# Patient Record
Sex: Female | Born: 1950 | Race: Black or African American | Hispanic: No | Marital: Married | Smoking: Never smoker
Health system: Southern US, Community
[De-identification: ages and names within clinical notes are randomized; demographics above are authoritative.]

## PROBLEM LIST (undated history)

## (undated) DIAGNOSIS — R05 Cough: Secondary | ICD-10-CM

## (undated) DIAGNOSIS — C801 Malignant (primary) neoplasm, unspecified: Secondary | ICD-10-CM

## (undated) DIAGNOSIS — K219 Gastro-esophageal reflux disease without esophagitis: Secondary | ICD-10-CM

## (undated) DIAGNOSIS — J42 Unspecified chronic bronchitis: Secondary | ICD-10-CM

## (undated) DIAGNOSIS — A630 Anogenital (venereal) warts: Secondary | ICD-10-CM

## (undated) DIAGNOSIS — D693 Immune thrombocytopenic purpura: Secondary | ICD-10-CM

## (undated) DIAGNOSIS — D759 Disease of blood and blood-forming organs, unspecified: Secondary | ICD-10-CM

## (undated) DIAGNOSIS — K6282 Dysplasia of anus: Secondary | ICD-10-CM

## (undated) DIAGNOSIS — L729 Follicular cyst of the skin and subcutaneous tissue, unspecified: Secondary | ICD-10-CM

## (undated) DIAGNOSIS — IMO0002 Reserved for concepts with insufficient information to code with codable children: Secondary | ICD-10-CM

## (undated) HISTORY — PX: BREAST SURGERY: SHX581

## (undated) HISTORY — PX: OOPHORECTOMY: SHX86

## (undated) HISTORY — DX: Dysplasia of anus: K62.82

## (undated) HISTORY — DX: Malignant (primary) neoplasm, unspecified: C80.1

## (undated) HISTORY — PX: OTHER SURGICAL HISTORY: SHX169

## (undated) HISTORY — DX: Anogenital (venereal) warts: A63.0

## (undated) HISTORY — DX: Immune thrombocytopenic purpura: D69.3

## (undated) HISTORY — PX: HAND SURGERY: SHX662

## (undated) HISTORY — DX: Cough: R05

---

## 1997-10-17 ENCOUNTER — Other Ambulatory Visit: Admission: RE | Admit: 1997-10-17 | Discharge: 1997-10-17 | Payer: Self-pay | Admitting: Radiology

## 1997-11-06 ENCOUNTER — Ambulatory Visit (HOSPITAL_BASED_OUTPATIENT_CLINIC_OR_DEPARTMENT_OTHER): Admission: RE | Admit: 1997-11-06 | Discharge: 1997-11-06 | Payer: Self-pay | Admitting: Surgery

## 1999-11-02 ENCOUNTER — Other Ambulatory Visit: Admission: RE | Admit: 1999-11-02 | Discharge: 1999-11-02 | Payer: Self-pay | Admitting: Gynecology

## 2002-02-05 ENCOUNTER — Other Ambulatory Visit: Admission: RE | Admit: 2002-02-05 | Discharge: 2002-02-05 | Payer: Self-pay | Admitting: Gynecology

## 2003-04-23 ENCOUNTER — Other Ambulatory Visit: Admission: RE | Admit: 2003-04-23 | Discharge: 2003-04-23 | Payer: Self-pay | Admitting: Gynecology

## 2004-04-29 ENCOUNTER — Other Ambulatory Visit: Admission: RE | Admit: 2004-04-29 | Discharge: 2004-04-29 | Payer: Self-pay | Admitting: Gynecology

## 2004-09-04 ENCOUNTER — Ambulatory Visit (HOSPITAL_COMMUNITY): Admission: RE | Admit: 2004-09-04 | Discharge: 2004-09-04 | Payer: Self-pay | Admitting: Gastroenterology

## 2004-11-05 ENCOUNTER — Encounter: Admission: RE | Admit: 2004-11-05 | Discharge: 2004-11-05 | Payer: Self-pay | Admitting: Gastroenterology

## 2005-02-02 ENCOUNTER — Emergency Department (HOSPITAL_COMMUNITY): Admission: EM | Admit: 2005-02-02 | Discharge: 2005-02-03 | Payer: Self-pay | Admitting: Emergency Medicine

## 2005-05-10 HISTORY — PX: OTHER SURGICAL HISTORY: SHX169

## 2005-05-11 ENCOUNTER — Other Ambulatory Visit: Admission: RE | Admit: 2005-05-11 | Discharge: 2005-05-11 | Payer: Self-pay | Admitting: Gynecology

## 2006-01-01 ENCOUNTER — Encounter: Admission: RE | Admit: 2006-01-01 | Discharge: 2006-01-01 | Payer: Self-pay | Admitting: Gastroenterology

## 2006-03-17 ENCOUNTER — Ambulatory Visit (HOSPITAL_COMMUNITY): Admission: EM | Admit: 2006-03-17 | Discharge: 2006-03-17 | Payer: Self-pay | Admitting: Gastroenterology

## 2006-03-17 ENCOUNTER — Encounter (INDEPENDENT_AMBULATORY_CARE_PROVIDER_SITE_OTHER): Payer: Self-pay | Admitting: Specialist

## 2006-05-23 ENCOUNTER — Other Ambulatory Visit: Admission: RE | Admit: 2006-05-23 | Discharge: 2006-05-23 | Payer: Self-pay | Admitting: Gynecology

## 2007-07-14 ENCOUNTER — Other Ambulatory Visit: Admission: RE | Admit: 2007-07-14 | Discharge: 2007-07-14 | Payer: Self-pay | Admitting: Gynecology

## 2007-08-21 IMAGING — CT CT HEAD W/O CM
1 series · 16 of 30 positions shown, 20 images · IV contrast (agent unspecified)
Comparison: None

CLINICAL DATA: Syncope

HEAD CT WITHOUT CONTRAST:
TECHNIQUE: 5mm collimated images were obtained from the base of the skull
through the vertex according to standard protocol without contrast.

[Series 2: head_seq 4.5 h42s st · axial · 0.45mm/px · z∈[+1057,+1183]mm · 16 of 32 slices shown, 20 images]
[im 2/32  brain]
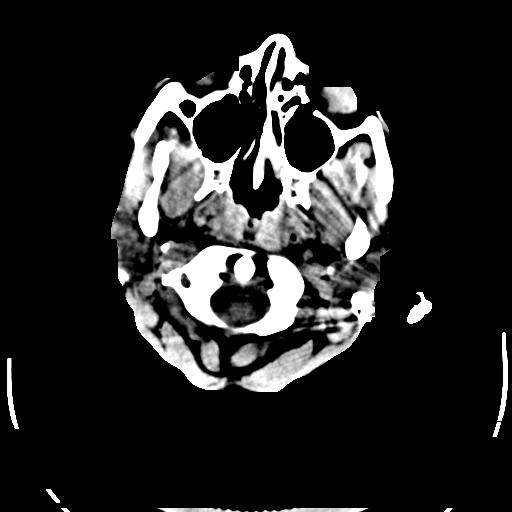
[im 2/32  bone]
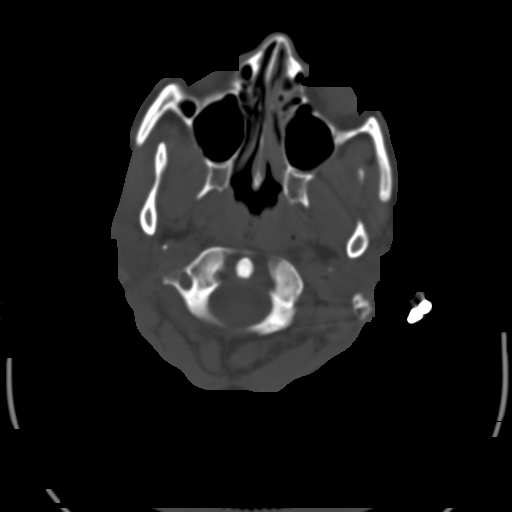
[im 4/32  brain]
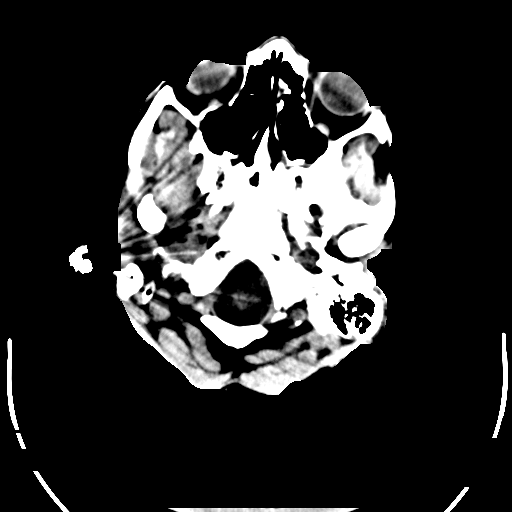
[im 6/32  brain]
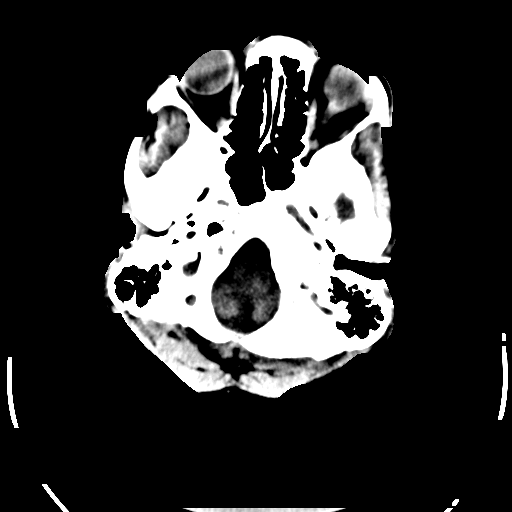
[im 8/32  brain]
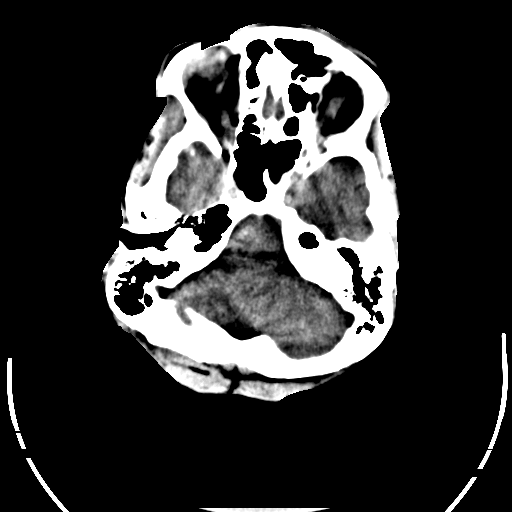
[im 9/32  brain]
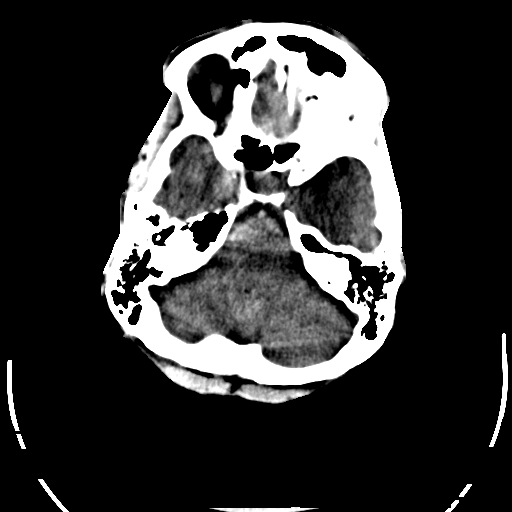
[im 9/32  bone]
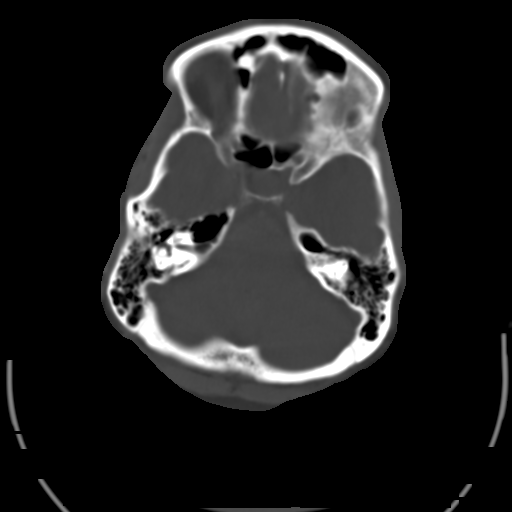
[im 11/32  brain]
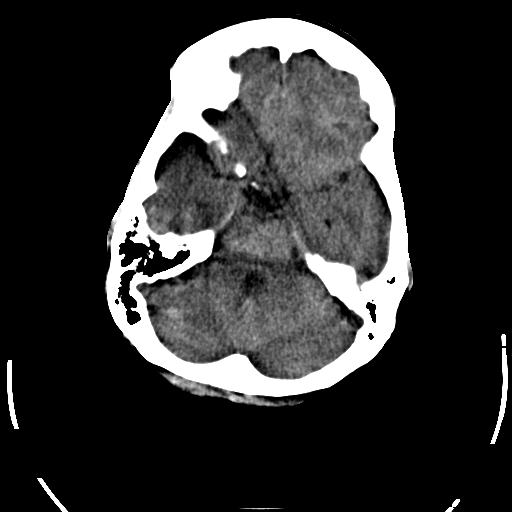
[im 13/32  brain]
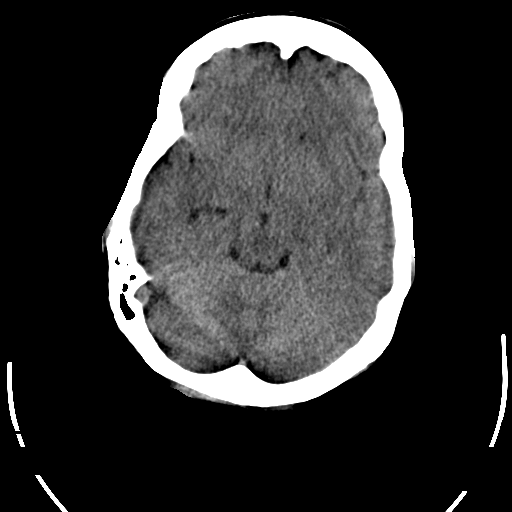
[im 15/32  brain]
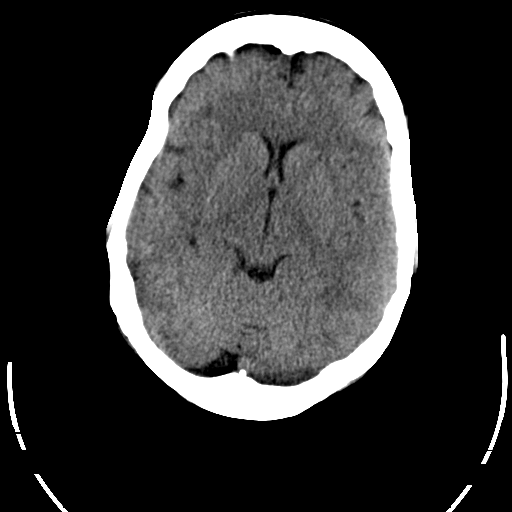
[im 17/32  brain]
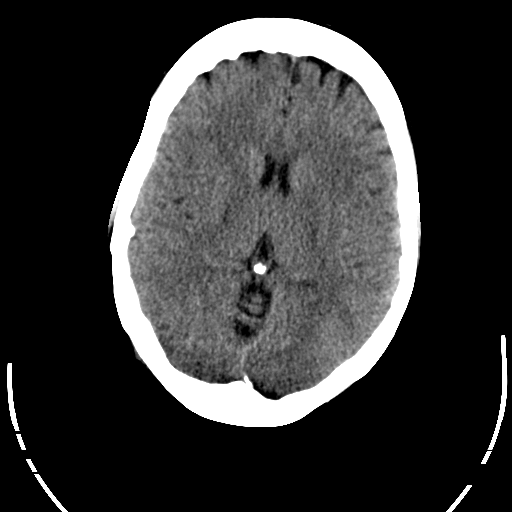
[im 17/32  bone]
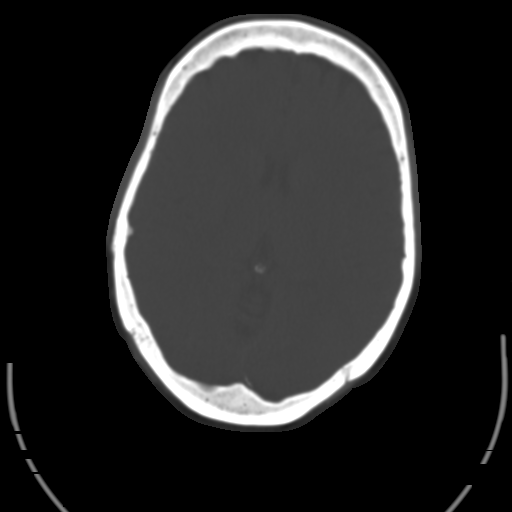
[im 19/32  brain]
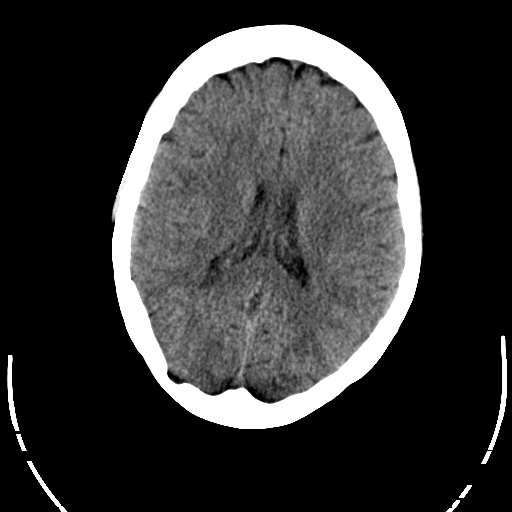
[im 21/32  brain]
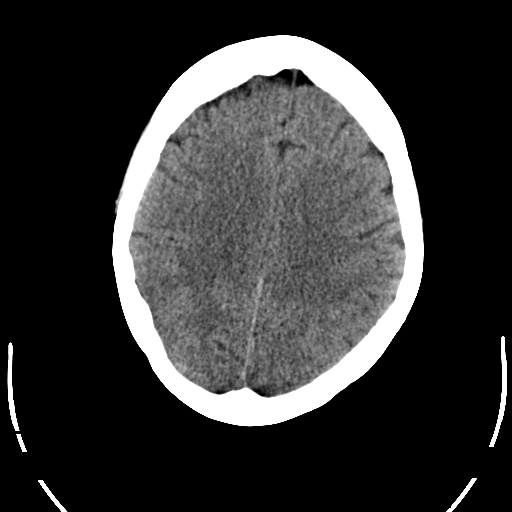
[im 23/32  brain]
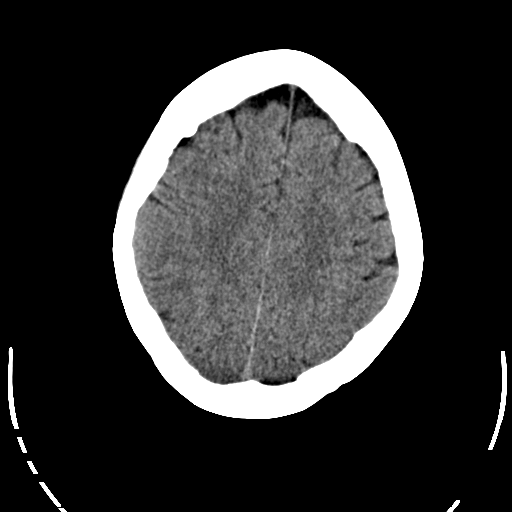
[im 24/32  brain]
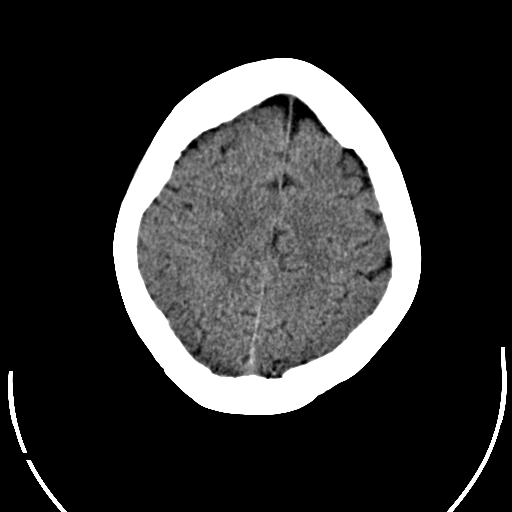
[im 24/32  bone]
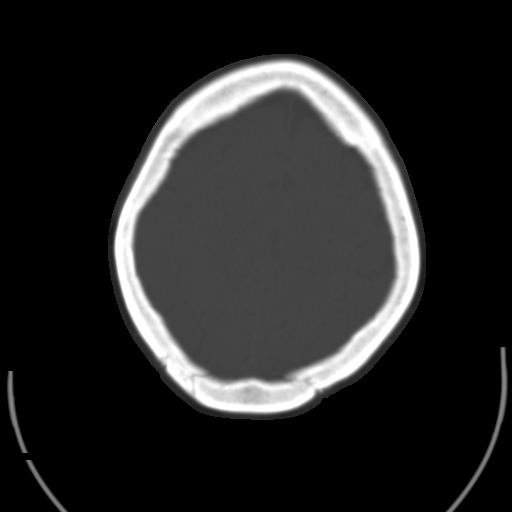
[im 26/32  brain]
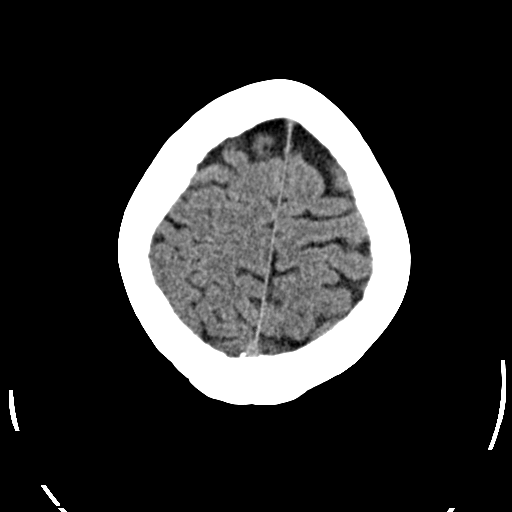
[im 28/32  brain]
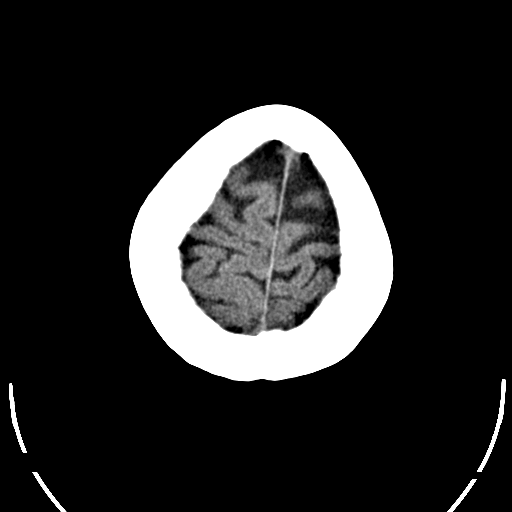
[im 30/32  brain]
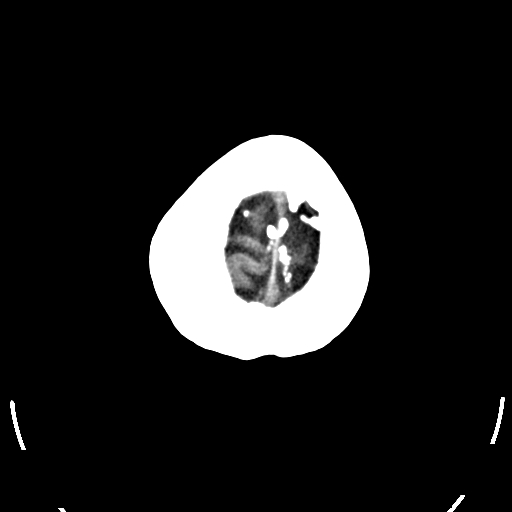

[16 of 30 positions shown; findings below may reference images not displayed]

FINDINGS: There is no evidence of intracranial hemorrhage, brain edema, or mass
effect.  No other intra-axial abnormalities are seen, and the ventricles are
within normal limits.  No abnormal extra-axial fluid collections or masses are
identified.  No skull abnormalities are noted.
IMPRESSION: No acute intracranial abnormality.

## 2007-08-24 ENCOUNTER — Ambulatory Visit (HOSPITAL_BASED_OUTPATIENT_CLINIC_OR_DEPARTMENT_OTHER): Admission: RE | Admit: 2007-08-24 | Discharge: 2007-08-24 | Payer: Self-pay | Admitting: Gynecology

## 2007-08-24 ENCOUNTER — Encounter: Payer: Self-pay | Admitting: Gynecology

## 2007-10-19 ENCOUNTER — Ambulatory Visit: Payer: Self-pay | Admitting: Internal Medicine

## 2007-10-24 ENCOUNTER — Ambulatory Visit: Admission: RE | Admit: 2007-10-24 | Discharge: 2007-10-24 | Payer: Self-pay | Admitting: Gynecologic Oncology

## 2007-10-25 LAB — CBC WITH DIFFERENTIAL/PLATELET
BASO%: 0.5 % (ref 0.0–2.0)
EOS%: 0.4 % (ref 0.0–7.0)
Eosinophils Absolute: 0 10*3/uL (ref 0.0–0.5)
HGB: 13.4 g/dL (ref 11.6–15.9)
MCHC: 33 g/dL (ref 32.0–36.0)
MONO#: 0.6 10*3/uL (ref 0.1–0.9)
NEUT#: 4.4 10*3/uL (ref 1.5–6.5)
RDW: 14.3 % (ref 11.3–14.5)
WBC: 7.8 10*3/uL (ref 3.9–10.0)

## 2007-10-25 LAB — COMPREHENSIVE METABOLIC PANEL
ALT: 51 U/L — ABNORMAL HIGH (ref 0–35)
AST: 38 U/L — ABNORMAL HIGH (ref 0–37)
Albumin: 4.5 g/dL (ref 3.5–5.2)
Alkaline Phosphatase: 105 U/L (ref 39–117)
BUN: 10 mg/dL (ref 6–23)
Calcium: 9.8 mg/dL (ref 8.4–10.5)
Sodium: 142 mEq/L (ref 135–145)
Total Bilirubin: 0.4 mg/dL (ref 0.3–1.2)
Total Protein: 7.6 g/dL (ref 6.0–8.3)

## 2007-10-25 LAB — LACTATE DEHYDROGENASE: LDH: 192 U/L (ref 94–250)

## 2007-10-27 LAB — FOLATE RBC: RBC Folate: 640 ng/mL — ABNORMAL HIGH (ref 180–600)

## 2007-10-28 LAB — IRON AND TIBC
Iron: 69 ug/dL (ref 42–145)
TIBC: 345 ug/dL (ref 250–470)
UIBC: 276 ug/dL

## 2007-10-28 LAB — VON WILLEBRAND PANEL: Ristocetin-Cofactor: 115 % (ref 50–150)

## 2007-11-06 LAB — CBC WITH DIFFERENTIAL/PLATELET
EOS%: 0.7 % (ref 0.0–7.0)
HCT: 39.5 % (ref 34.8–46.6)
MCH: 27.3 pg (ref 26.0–34.0)
MCHC: 33.6 g/dL (ref 32.0–36.0)
MCV: 81.1 fL (ref 81.0–101.0)
MONO%: 7.7 % (ref 0.0–13.0)
NEUT#: 3 10*3/uL (ref 1.5–6.5)
lymph#: 2.2 10*3/uL (ref 0.9–3.3)

## 2007-11-17 ENCOUNTER — Ambulatory Visit (HOSPITAL_COMMUNITY): Admission: RE | Admit: 2007-11-17 | Discharge: 2007-11-17 | Payer: Self-pay | Admitting: Internal Medicine

## 2007-11-17 ENCOUNTER — Ambulatory Visit: Payer: Self-pay | Admitting: Internal Medicine

## 2007-11-17 ENCOUNTER — Encounter: Payer: Self-pay | Admitting: Internal Medicine

## 2007-11-22 LAB — CBC WITH DIFFERENTIAL/PLATELET
Basophils Absolute: 0 10*3/uL (ref 0.0–0.1)
HCT: 40.2 % (ref 34.8–46.6)
HGB: 13.3 g/dL (ref 11.6–15.9)
MCHC: 33.1 g/dL (ref 32.0–36.0)
MCV: 81.1 fL (ref 81.0–101.0)
MONO#: 0.5 10*3/uL (ref 0.1–0.9)
NEUT%: 44.6 % (ref 39.6–76.8)
WBC: 5.8 10*3/uL (ref 3.9–10.0)

## 2007-11-22 LAB — COMPREHENSIVE METABOLIC PANEL
Albumin: 4.4 g/dL (ref 3.5–5.2)
Alkaline Phosphatase: 103 U/L (ref 39–117)
BUN: 13 mg/dL (ref 6–23)
CO2: 29 mEq/L (ref 19–32)
Calcium: 9.8 mg/dL (ref 8.4–10.5)
Creatinine, Ser: 0.91 mg/dL (ref 0.40–1.20)

## 2007-11-22 LAB — LACTATE DEHYDROGENASE: LDH: 181 U/L (ref 94–250)

## 2008-11-08 ENCOUNTER — Ambulatory Visit: Payer: Self-pay | Admitting: Gynecology

## 2008-11-08 ENCOUNTER — Other Ambulatory Visit: Admission: RE | Admit: 2008-11-08 | Discharge: 2008-11-08 | Payer: Self-pay | Admitting: Gynecology

## 2008-11-08 ENCOUNTER — Encounter: Payer: Self-pay | Admitting: Gynecology

## 2008-11-19 ENCOUNTER — Ambulatory Visit: Payer: Self-pay | Admitting: Gynecology

## 2009-06-05 ENCOUNTER — Ambulatory Visit: Payer: Self-pay | Admitting: Gynecology

## 2010-05-31 ENCOUNTER — Encounter: Payer: Self-pay | Admitting: Internal Medicine

## 2010-09-22 NOTE — Consult Note (Signed)
Cynthia Nunez, Cynthia Nunez                ACCOUNT NO.:  [id number]   MEDICAL RECORD NO.:  [id number]          PATIENT TYPE:  OUT   LOCATION:  GYN                          FACILITY:  [facility]   PHYSICIAN:  Cynthia Nunez    DATE OF BIRTH:  1951-02-04   DATE OF CONSULTATION:  10/24/2007  DATE OF DISCHARGE:                                 CONSULTATION   REFERRING PHYSICIAN:  Dr. Colin Nunez.   The patient is seen today in consultation at the request of Dr.  Audie Nunez.   Cynthia Nunez is a very pleasant 60 year old gravida 0, who has been  followed by Cynthia Nunez for many years.  She has had this persistent  right adnexal mass that was the most consistent with a dermoid and has  been followed for approximately 3-4 years.  The patient declined surgery  as she was otherwise asymptomatic.  At her last evaluation in March  2009, her CA-125 was normal at 6.  The ultrasound showed a 49 mm  echogenic mass in the right ovary with peripheral flow.  The mass had  increased slightly in size, and the patient was becoming increasingly  symptomatic.  As a result of this, she underwent surgery consisting of  diagnostic laparoscopy and laparoscopic BSO.  Her surgery was performed  on August 24, 2007.  Operative findings included; the right ovary was  noted to be enlarged at approximately 5 cm with fallopian tube segment.  There was some ovary adhesions to the right pelvic sidewall.  There was  no evidence of endometriosis or other pathology.  There was no comment  of any surface involvement.  Final pathology revealed negative washings.  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  It was low-grade.  The  capsule was intact and the washings were negative.  It was for this  reason that she was referred to [place].  I did speak with Cynthia Nunez by  phone.  He obtained a CT scan of the abdomen and pelvis on Sep 19, 2007,  which revealed a 3  cm cyst on the right lobe of the liver with a  subcentimeter cyst noticed as well.  There was no gallstones or renal  calculi.  There is no adrenal or renal masses.  There was no bowel  issues or lesions.  There was no adenopathy, evidence of carcinomatosis  or free fluid.  The patient comes in today for a consultative visit to  discuss follow-up.  She is recovering well from her surgery.  She was  having some hot flashes prior to her BSO, but since that time they have  gotten somewhat worse.  She has them about 2-3 per day.  She has some  sweats at night, but they do not wake her up.  She is taking some black  cohosh capsules.  She occasionally forgets to take them, but at this  point that is what she prefers to do rather than any hormone replacement  therapy in a more traditional sense.   PAST  SURGICAL HISTORY:  1. TAH for fibroids.  2. Laparoscopic BSO.  3. Left hand surgery.  4. Bunionectomy bilaterally.   MEDICATIONS:  Black cohosh capsule.   ALLERGIES:  NONE.   SOCIAL HISTORY:  She denies use of tobacco.  She drinks alcohol  occasionally.  She is married.  She is currently an employed, but has  been a the [employer] in the past.   FAMILY HISTORY:  Significant for hypertension, diabetes and coronary  disease, both on her mom and father's side.  There is no history of any  cancer in the family.   HEALTH MAINTENANCE:  She is up-to-date on her mammogram.  She had a  colonoscopy at the age of 48.   PHYSICAL EXAMINATION:  VITAL SIGNS:  Height 5 feet 6, weight 155 pounds.  Blood pressure 134/84, pulse 58.  GENERAL:  A well-developed, well-nourished female in no acute distress.  NECK:  Supple.  There is no lymphadenopathy, no thyromegaly.  LUNGS:  Clear to auscultation bilaterally.  CARDIOVASCULAR:  Regular rate and rhythm.  ABDOMEN:  She has a well-healed Pfannenstiel skin incision.  She has  well-healed laparoscopy skin incisions.  Abdomen is soft, somewhat  tender  due to her recent surgery, but there is no masses or  hepatosplenomegaly.  Groins are negative for adenopathy.  EXTREMITIES:  There is no edema.  PELVIC:  External genitalia is within normal limits.  Bimanual  examination reveals no masses or nodularity, rectal confirms.   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  I discussed  with her that there are less than 100 of these in the literature.  There  has been one reported case of advanced disease which was noted at the  time of surgery.  The patient did well with surgery and postoperative  radiation.  These are felt to behave in a very low malignant potential  fashion and can be covered and followed expectantly.  There is no need  for staging procedures or additional adjuvant therapy at this point.  I  would not unless she had symptoms proceed with routine imaging, but she  was counseled that if she develops pain or persistent area of discomfort  that, that needs to be brought to Dr. Reynold Bowen or our attention and  appropriate imaging will ensue.  Her questions were elicited and  answered to her satisfaction.  She was pleased with her consultation  today and will return to the care of Cynthia Nunez.  She has my card and  knows that she is free to call me should she have any other questions.      Cynthia Nunez  Electronically Signed     PAG/MEDQ  D:  10/24/2007  T:  10/24/2007  Job:  [id number]   cc:   Cynthia Nunez, M.D.  Fax: [phone]   Cynthia Nunez, R.N.  [address]  [place], [place] [address]

## 2010-09-22 NOTE — H&P (Signed)
NAME:  Cynthia Nunez, Cynthia Nunez                ACCOUNT NO.:  [id number]   MEDICAL RECORD NO.:  [id number]          PATIENT TYPE:  AMB   LOCATION:  [facility]                         FACILITY:  [facility]   PHYSICIAN:  Timothy P. Fontaine, M.D.DATE OF BIRTH:  10-May-1951   DATE OF ADMISSION:  DATE OF DISCHARGE:                              HISTORY & PHYSICAL   CHIEF COMPLAINT:  Pelvic pain, persistent right ovarian cystic mass.   HISTORY OF PRESENT ILLNESS:  A 60 year old female status post TAH for  leiomyomata presents with a history of persistent right cystic ovarian  mass consistent with a dermoid.  She had been followed for several years  expectantly with this mass having declined surgeries asymptomatic.  Her  last evaluation showed CA-125 of 6.0 in March 2009, and ultrasound which  showed a 49 mm echogenic with cystic area mass in the right ovary with  peripheral flow.  The patient most recently has been experiencing  increasing pelvic pain on the right and she wants to proceed with  removal of this mass.  She is admitted at this time for laparoscopic  assessment bilateral salpingo-oophorectomy, possible exploratory  laparotomy, bilateral salpingo-oophorectomy.   PAST MEDICAL HISTORY:  Uncomplicated.   PAST SURGICAL HISTORY:  1. Breast biopsy.  2. Orthopedic feet surgery.  3. Diagnostic laparoscopy with ultimate TAH in 1993 for leiomyoma.   CURRENT MEDICATIONS:  None.   ALLERGIES:  No medications.   REVIEW OF SYSTEMS:  Noncontributory.   FAMILY HISTORY:  Noncontributory.   SOCIAL HISTORY:  Noncontributory.   PHYSICAL EXAMINATION:  VITAL SIGNS:  Afebrile, vital signs stable.  HEENT: Normal.  LUNGS:  Clear.  CARDIAC:  Regular rate.  No murmurs, rubs or gallops.  ABDOMEN:  Exam is benign.  PELVIC:  EG/BUS, vagina normal.  Bimanual 5 cm mass right adnexa, tender  to manipulation, left adnexa negative for pain or mass.  Rectovaginal  exam is normal.   ASSESSMENT:  A 60 year old status post  hysterectomy, several year  history of right cystic solid ovarian mass is suspicious for dermoid who  elected for expectant management.  Most recently she has been  experiencing increasing discomfort with some mild increase in size of  this mass and she is admitted at this time for laparoscopic evaluation,  bilateral salpingo-oophorectomy.  She has had a negative CA-125.  She  initially just wanted her right ovary removed.  I reviewed with her the  issues of leaving her left even if it appeared normal and the long-term  issues of ovarian cancer risk or reoperation for benign disease risk.  She has an elevated FSH at 70, and she ultimately agreed to a bilateral  salpingo-oophorectomy.  She does understand if for some reason the left  appears normal, but due to adhesive disease or other pathology, it would  be increased risks to remove the left ovaries.  She would prefer to  leave it, and she accepts the risks, long-term of disease, but would  prefer to have both removed if possible.  She also understands that we  will attempt this laparoscopically, but if at any time during the  procedure it is felt unsafe to proceed with laparoscopic surgery such as  adhesive disease or other complications, we would then proceed with  exploratory laparotomy with a larger incision.  She understands and  accepts these possibilities.  She understands also that she is  postmenopausal, that by removing both ovaries, I would not anticipate a  significant change in her mental status, but no guarantees and that she  may become symptomatic from removal of both ovaries despite having  elevated FSH of 70.  She understands and accepts this possibility.  I  reviewed the expected intraoperative postoperative courses with her to  include instrumentation, trocar placement, insufflation, multiple port  sites, use of electrocautery, sharp blunt dissection, harmonic scalpel  laser as well as potential for metal clips for  hemostasis.  The acute  intraoperative postoperative risks were discussed with her to include  the anesthetic risks as well as the risk of surgery to include bleeding,  hemorrhage necessitating transfusion.  The risks of transfusion  including transfusion reaction, hepatitis, HIV, Mad Cow disease and  other unknown entities.  The risks of infection to include internal  requiring prolonged antibiotics, abscess formation requiring  reoperation, abscess drainage, incisional complications including  opening and draining of incisions closure by secondary intention as well  as long-term issues such as keloid formation and hernia formation all  discussed with her.  The risks of inadvertent injury to internal organs  including bowel, bladder, ureters, vessels and nerves necessitating  major exploratory reparative surgeries or future reparative surgeries  either immediately recognized or delay recognized requiring major  exploratory reparative surgeries, bowel resection, bladder repair,  ureteral repair, ostomy formation was all discussed, understood and  accepted.  The patient's questions were answered to her satisfaction.  We will plan on a bowel prep given her prior surgeries and she  understands and agrees to this also.      Timothy P. Fontaine, M.D.  Electronically Signed     TPF/MEDQ  D:  08/17/2007  T:  08/17/2007  Job:  [id number]

## 2010-09-22 NOTE — Op Note (Signed)
NAME:  Cynthia Nunez, Cynthia Nunez                ACCOUNT NO.:  [id number]   MEDICAL RECORD NO.:  [id number]          PATIENT TYPE:  AMB   LOCATION:  [facility]                         FACILITY:  [facility]   PHYSICIAN:  Timothy P. Fontaine, M.D.DATE OF BIRTH:  [birth year]   DATE OF PROCEDURE:  08/24/2007  DATE OF DISCHARGE:                               OPERATIVE REPORT   PREOPERATIVE DIAGNOSES:  1. Right ovarian cystic mass, dermoid suspect.  2. Right-sided pelvic pain.   POSTOPERATIVE DIAGNOSES:  1. Right ovarian cystic mass, dermoid suspect.  2. Right-sided pelvic pain.  3. Pelvic adhesions.   PROCEDURE:  Laparoscopic bilateral salpingo-oophorectomy, lysis of  adhesions.   SURGEON:  Timothy P. Fontaine, M.D.   ASSISTANT:  Rande Brunt. Eda Paschal, M.D.   ANESTHESIA:  General with 0.25% Marcaine skin injection.   ESTIMATED BLOOD LOSS:  Minimal.   COMPLICATIONS:  None.   SPECIMENS:  1. Opening cell washing.  2. Left ovary and fallopian tube segment.  3. Right ovary with cystic mass fallopian tube segment.   FINDINGS:  EUA external BUS, vagina normal.  Bimanual with 5-6 cm right  adnexal mass, laparoscopic pelvis filmy adhesions sigmoid to vaginal  cuff lysed, left ovary and fallopian tube segment grossly normal, free  and mobile.  Right ovary enlarged approximately 5 cm with fallopian tube  segment.  Ovarian adhesions to the right pelvic sidewall.  No evidence  of pelvic endometriosis or other pathology.  Upper abdominal exam  grossly normal, appendix free and mobile, liver smooth, gallbladder not  visualized well.   PROCEDURE IN DETAIL:  The patient was taken to the operating room,  underwent general endotracheal anesthesia, was placed in low dorsal  lithotomy position, received abdominal perineal vaginal preparation with  Betadine solution.  Bladder emptied with indwelling Foley  catheterization.  EUA performed.  A sponge stick placed within the  vagina for manipulation.  The patient was  draped in the usual fashion.  A transverse infraumbilical incision was made using the OptiView 10 mm  laparoscopic trocar.  The abdomen was directly entered without  difficulty and subsequently insufflated.  Right and left 5 mm suprapubic  ports were then placed under direct visualization after  transillumination of the vessels without difficulty.  Examination of the  pelvis and upper abdominal exam was carried out with findings noted  above.  The left ovary was then elevated, left ureter identified away  from the operative site and using the Harmonic scalpel the  infundibulopelvic ligament and vessels were transected without  difficulty.  The ovary was then progressively freed from its attachments  through progressive transection using the Harmonic scalpel of the  paraovarian peritoneal attachments to ultimately free the ovary.  This  was placed in the cul-de-sac and attention was then turned to the right  ovarian cystic mass.  The ovary was elevated, paraovarian adhesions to  the sidewall were lysed using the Harmonic scalpel to free up the ovary.  The ureter was identified away from the operative site and the  infundibulopelvic ligament and vessels were transected using the  Harmonic scalpel.  The ovary was progressively freed  from its  attachments through transection using the Harmonic scalpel of the  peritoneal attachments and again ultimately the ovary was freed.  The 10  mm laparoscope was replaced with a 5 mm laparoscope which was placed  through a suprapubic port and an Endopouch was placed through the 10 mm  infraumbilical port to retrieve both specimens.  Of note, an opening  cell washing was taken prior to any surgery upon entry into the abdomen  and was sent to pathology with heparin.  Due to the size of the right  ovarian mass the transverse infraumbilical incision was extended as were  the fascial layers and ultimately the specimens were removed intact and  sent to  pathology.  The 10 mm laparoscopic port was replaced in the  abdomen, the abdomen reinsufflated and the pelvis was irrigated and  inspected showing adequate hemostasis at all surgical sites.  There were  some filmy adhesions from the lower sigmoid to the cuff which were lysed  with the Harmonic scalpel without difficulty.  The suprapubic ports were  then removed, the gas slowly allowed to escape, the port sites and  pelvic dissection sites were inspected under low pressure situation  showing adequate hemostasis.  The infraumbilical port was then removed.  The skin incisions were all injected using 0.25% Marcaine and all skin  incisions closed using 3-0 chromic in an interrupted cutaneous stitch.  A pressure dressing was applied to the infraumbilical port.  After the  surgery was over before the patient was awakened it was noted when  dressing the suprapubic ports that the left lower port was swelling  consistent with hematoma.  I removed the interrupted cuticular stitch  that was placed to probe the incision.  There was a small arterial at  the skin level that was oozing and that was cauterized using silver  nitrate.  Subsequently two deep 3-0 chromic cuticular stitches were  placed, the incision observed with apparent hemostasis and a pressure  dressing was applied to the site.  The patient was awakened without  difficulty and was taken to the recovery room in good condition having  tolerated the procedure well.      Timothy P. Fontaine, M.D.  Electronically Signed     TPF/MEDQ  D:  08/24/2007  T:  08/24/2007  Job:  [id number]

## 2010-09-25 NOTE — Op Note (Signed)
NAMEAYERIM, BERQUIST                ACCOUNT NO.:  [id number]   MEDICAL RECORD NO.:  [id number]          PATIENT TYPE:  AMB   LOCATION:  ENDO                         [facility]ACILITY[facility]   PHYSICIAN:  Graylin Shiver, M.D.   DATE OF BIRTH:  Mar 05, 1951   DATE OF PROCEDURE:  09/04/2004  DATE OF DISCHARGE:                                 OPERATIVE REPORT   PROCEDURE:  Flexible sigmoidoscopy.   INDICATIONS:  Screening.   Informed consent was obtained after explanation of the risks of bleeding,  infection and perforation.   PREMEDICATION:  Fentanyl 75 mcg IV, Versed 6 mg IV.   PROCEDURE:  A full colonoscopy was intended.   DESCRIPTION OF PROCEDURE:  With the patient in the left lateral decubitus  position, a rectal exam was performed, no masses were felt. The Olympus  colonoscope was inserted into the rectum and advanced into the sigmoid  colon. Immediately upon entrance of the sigmoid colon, the colon was full of  large solid formed stool which prevented adequate visualization.  The  procedure was terminated. She will be reprepped and scheduled again      SFG/MEDQ  D:  09/04/2004  T:  09/04/2004  Job:  [id number]   cc:   Fleet Contras, M.D.  [address]  [place]  [place] [address]  Fax: [phone]

## 2010-09-25 NOTE — Op Note (Signed)
Cynthia Nunez, Cynthia Nunez                ACCOUNT NO.:  [id number]   MEDICAL RECORD NO.:  [id number]          PATIENT TYPE:  AMB   LOCATION:  ENDO                         FACILITY:  [facility]   PHYSICIAN:  Shirley Friar, MDDATE OF BIRTH:  August 24, 1950   DATE OF PROCEDURE:  03/17/2006  DATE OF DISCHARGE:                                 OPERATIVE REPORT   INDICATIONS:  Screening, constipation.   MEDICATIONS:  Propofol infusion by Anesthesia.   FINDINGS:  Rectal exam was normal.  The adult adjustable colonoscope was  inserted into an adequately prepped colon and advanced to the cecum where  the ileocecal valve and appendiceal orifice were identified.  In order to  reach the cecum the patient had to be turned in the supine position and  abdominal pressure had to be applied.  Her colon was very tortuous and  redundant.  On careful withdrawal the colonoscope revealed two diminutive  polyps in the proximal ascending colon.  They were removed with cold  biopsies.  On further withdrawal of the colonoscope no other mucosal  abnormalities were noted except in the rectosigmoid area where there was a  nodular area without any ulceration.  This area was biopsied x2.  This  appearance may be secondary to her prep.  Retroflexion revealed small  internal hemorrhoids.  No masses were seen.   ASSESSMENT:  1. Two diminutive polyps status post cold biopsy x2  2. Nodular mucosa and rectosigmoid area likely secondary to prep - status      post biopsies.  3. Small internal hemorrhoids.   PLAN:  1. Follow-up on path.  2. No aspirin products x 14 days.  3. High-fiber diet.  4. Repeat colonoscopy or virtual colonoscopy in 5 years.      Shirley Friar, MD  Electronically Signed     VCS/MEDQ  D:  03/17/2006  T:  03/18/2006  Job:  [id number]   cc:   Fleet Contras, M.D.  Fax: [phone]

## 2011-02-04 LAB — CBC
RBC: 5.3 — ABNORMAL HIGH
RDW: 14.2
WBC: 5.3

## 2011-02-04 LAB — DIFFERENTIAL
Eosinophils Absolute: 0
Eosinophils Relative: 1
Lymphocytes Relative: 53 — ABNORMAL HIGH
Lymphs Abs: 2.8
Monocytes Absolute: 0.3
Neutro Abs: 2.1
Neutrophils Relative %: 39 — ABNORMAL LOW

## 2011-02-04 LAB — CHROMOSOME ANALYSIS, BONE MARROW

## 2011-02-08 ENCOUNTER — Encounter: Payer: Self-pay | Admitting: Gynecology

## 2011-04-28 ENCOUNTER — Other Ambulatory Visit: Payer: Self-pay | Admitting: Gastroenterology

## 2011-04-28 ENCOUNTER — Ambulatory Visit (HOSPITAL_COMMUNITY)
Admission: RE | Admit: 2011-04-28 | Discharge: 2011-04-28 | Disposition: A | Discharge status: Home / Self Care | Payer: Managed Care, Other (non HMO) | Source: Ambulatory Visit | Attending: Gastroenterology | Admitting: Gastroenterology

## 2011-04-28 ENCOUNTER — Encounter (HOSPITAL_COMMUNITY): Payer: Self-pay | Admitting: Registered Nurse

## 2011-04-28 ENCOUNTER — Encounter (HOSPITAL_COMMUNITY)
Admission: RE | Disposition: A | Discharge status: Home / Self Care | Payer: Self-pay | Source: Ambulatory Visit | Attending: Gastroenterology

## 2011-04-28 ENCOUNTER — Encounter (HOSPITAL_COMMUNITY): Payer: Self-pay | Admitting: Gastroenterology

## 2011-04-28 ENCOUNTER — Ambulatory Visit (HOSPITAL_COMMUNITY): Payer: Managed Care, Other (non HMO) | Admitting: Registered Nurse

## 2011-04-28 DIAGNOSIS — K219 Gastro-esophageal reflux disease without esophagitis: Secondary | ICD-10-CM | POA: Insufficient documentation

## 2011-04-28 DIAGNOSIS — K6282 Dysplasia of anus: Secondary | ICD-10-CM | POA: Insufficient documentation

## 2011-04-28 DIAGNOSIS — Z09 Encounter for follow-up examination after completed treatment for conditions other than malignant neoplasm: Secondary | ICD-10-CM | POA: Insufficient documentation

## 2011-04-28 DIAGNOSIS — A630 Anogenital (venereal) warts: Secondary | ICD-10-CM | POA: Insufficient documentation

## 2011-04-28 DIAGNOSIS — K648 Other hemorrhoids: Secondary | ICD-10-CM | POA: Insufficient documentation

## 2011-04-28 DIAGNOSIS — Z860100 Personal history of colon polyps, unspecified: Secondary | ICD-10-CM

## 2011-04-28 HISTORY — DX: Gastro-esophageal reflux disease without esophagitis: K21.9

## 2011-04-28 HISTORY — DX: Follicular cyst of the skin and subcutaneous tissue, unspecified: L72.9

## 2011-04-28 HISTORY — PX: COLONOSCOPY: SHX5424

## 2011-04-28 HISTORY — DX: Unspecified chronic bronchitis: J42

## 2011-04-28 HISTORY — DX: Reserved for concepts with insufficient information to code with codable children: IMO0002

## 2011-04-28 HISTORY — DX: Disease of blood and blood-forming organs, unspecified: D75.9

## 2011-04-28 SURGERY — COLONOSCOPY
Anesthesia: Monitor Anesthesia Care

## 2011-04-28 MED ORDER — LACTATED RINGERS IV SOLN
INTRAVENOUS | Status: DC | PRN
  Administered 2011-04-28: 13:00:00 via INTRAVENOUS

## 2011-04-28 MED ORDER — SODIUM CHLORIDE 0.9 % IJ SOLN
Freq: Once | INTRAMUSCULAR | Status: DC
  Filled 2011-04-28: qty 1

## 2011-04-28 MED ORDER — PROPOFOL 10 MG/ML IV EMUL
INTRAVENOUS | Status: DC | PRN
  Administered 2011-04-28: 120 ug/kg/min via INTRAVENOUS

## 2011-04-28 MED ORDER — KETAMINE HCL 10 MG/ML IJ SOLN
INTRAMUSCULAR | Status: DC | PRN
  Administered 2011-04-28: 10 mg via INTRAVENOUS

## 2011-04-28 MED ORDER — MIDAZOLAM HCL 5 MG/5ML IJ SOLN
INTRAMUSCULAR | Status: DC | PRN
  Administered 2011-04-28: 2 mg via INTRAVENOUS

## 2011-04-28 MED ORDER — EPINEPHRINE HCL 0.1 MG/ML IJ SOLN
INTRAMUSCULAR | Status: AC
  Filled 2011-04-28: qty 10

## 2011-04-28 MED ORDER — LIDOCAINE HCL (CARDIAC) 20 MG/ML IV SOLN
INTRAVENOUS | Status: DC | PRN
  Administered 2011-04-28: 80 mg via INTRAVENOUS

## 2011-04-28 MED ORDER — FENTANYL CITRATE 0.05 MG/ML IJ SOLN
INTRAMUSCULAR | Status: DC | PRN
  Administered 2011-04-28: 50 ug via INTRAVENOUS

## 2011-04-28 NOTE — Transfer of Care (Signed)
Immediate Anesthesia Transfer of Care Note  Patient: Cynthia Nunez  Procedure(s) Performed:  COLONOSCOPY  Patient Location: PACU and Endoscopy Unit  Anesthesia Type: MAC  Level of Consciousness: awake and alert   Airway & Oxygen Therapy: Patient Spontanous Breathing and Patient connected to face mask oxygen  Post-op Assessment: Report given to PACU RN and Post -op Vital signs reviewed and stable  Post vital signs: Reviewed and stable  Complications: No apparent anesthesia complications

## 2011-04-28 NOTE — Anesthesia Preprocedure Evaluation (Addendum)
Anesthesia Evaluation  Patient identified by MRN, date of birth, ID band Patient awake    Reviewed: Allergy & Precautions, H&P , NPO status , Patient's Chart, lab work & pertinent test results, reviewed documented beta blocker date and time   History of Anesthesia Complications (+) AWARENESS UNDER ANESTHESIA  Airway Mallampati: II TM Distance: >3 FB Neck ROM: full    Dental No notable dental hx. (+) Partial Lower and Partial Upper   Pulmonary neg pulmonary ROS,  clear to auscultation  Pulmonary exam normal       Cardiovascular Exercise Tolerance: Good neg cardio ROS regular Normal    Neuro/Psych Negative Neurological ROS  Negative Psych ROS   GI/Hepatic negative GI ROS, Neg liver ROS, GERD-  ,  Endo/Other  Negative Endocrine ROS  Renal/GU negative Renal ROS  Genitourinary negative   Musculoskeletal   Abdominal   Peds  Hematology negative hematology ROS (+)   Anesthesia Other Findings   Reproductive/Obstetrics negative OB ROS                          Anesthesia Physical Anesthesia Plan  ASA: II  Anesthesia Plan: MAC   Post-op Pain Management:    Induction:   Airway Management Planned:   Additional Equipment:   Intra-op Plan:   Post-operative Plan:   Informed Consent: I have reviewed the patients History and Physical, chart, labs and discussed the procedure including the risks, benefits and alternatives for the proposed anesthesia with the patient or authorized representative who has indicated his/her understanding and acceptance.   Dental Advisory Given  Plan Discussed with: CRNA  Anesthesia Plan Comments:         Anesthesia Quick Evaluation

## 2011-04-28 NOTE — Preoperative (Signed)
Beta Blockers   Reason not to administer Beta Blockers:Not Applicable 

## 2011-04-28 NOTE — H&P (Signed)
See scanned H and P.

## 2011-04-28 NOTE — Interval H&P Note (Signed)
History and Physical Interval Note:  04/28/2011 1:34 PM  Cynthia Nunez  has presented today for surgery, with the diagnosis of Hx of Polyps  The various methods of treatment have been discussed with the patient and family. After consideration of risks, benefits and other options for treatment, the patient has consented to  Procedure(s): COLONOSCOPY as a surgical intervention .  The patients' history has been reviewed, patient examined, no change in status, stable for surgery.  I have reviewed the patients' chart and labs.  Questions were answered to the patient's satisfaction.     [name] C.

## 2011-04-28 NOTE — Anesthesia Postprocedure Evaluation (Signed)
  Anesthesia Post-op Note  Patient: Cynthia Nunez  Procedure(s) Performed:  COLONOSCOPY  Patient Location: PACU  Anesthesia Type: MAC  Level of Consciousness: awake and alert   Airway and Oxygen Therapy: Patient Spontanous Breathing  Post-op Pain: mild  Post-op Assessment: Post-op Vital signs reviewed, Patient's Cardiovascular Status Stable, Respiratory Function Stable, Patent Airway and No signs of Nausea or vomiting  Post-op Vital Signs: stable  Complications: No apparent anesthesia complications

## 2011-04-28 NOTE — Brief Op Note (Signed)
See endopro note 

## 2011-05-05 ENCOUNTER — Encounter (HOSPITAL_COMMUNITY): Payer: Self-pay | Admitting: Gastroenterology

## 2011-06-24 ENCOUNTER — Encounter (INDEPENDENT_AMBULATORY_CARE_PROVIDER_SITE_OTHER): Payer: Self-pay | Admitting: Surgery

## 2011-06-29 NOTE — Op Note (Signed)
[facility] [address] [place], [place]  [address]  COLONOSCOPY PROCEDURE REPORT  PATIENT:  Cynthia Nunez, Cynthia Nunez  MR#:  [id number] BIRTHDATE:  01/07/1951, 60 yrs. old  GENDER:  female ENDOSCOPIST:  Charlott Rakes, MD REF. BY:  Fleet Contras, M.D. PROCEDURE DATE:  04/28/2011 PROCEDURE:  Colonoscopy with biopsy ASA CLASS:  Class II INDICATIONS:  history of pre-cancerous (adenomatous) colon polyps  MEDICATIONS:   See Anesthesia Report.  DESCRIPTION OF PROCEDURE:   After the risks benefits and alternatives of the procedure were thoroughly explained, informed consent was obtained.  The Pentax Ped Colon [id number] endoscope was introduced through the anus and advanced to the cecum, which was identified by both the appendix and ileocecal valve, without limitations.  The quality of the prep was good.  The instrument was then slowly withdrawn as the colon was fully examined. <<PROCEDUREIMAGES>>  FINDINGS:  Rectal exam unremarkable.  Pediatric colonoscope inserted into the colon and advanced to the cecum, where the appendiceal orifice and ileocecal valve were identified.    On careful withdrawal of the colonoscope the colon was unremarkable. Retroflexion revealed a focal area of nodular wart-like mucosa adjacent to the dentate line. Small internal hemorrhoids were noted. Biopsies were taken for histologic purposes. One of the biopsy  sites caused bleeding that was slow to subside so 4cc of epinephrine:saline mixture was injected submucosally with good blanching achieved. Injection of epinephrine:saline mixture achieved hemostasis.  COMPLICATIONS:  None  IMPRESSION:   1. Nodular area at anorectum -?condyloma -s/p biopsies and epinephrine 2. Small internal hemorrhoids  RECOMMENDATIONS: 1. F/U on biopsies 2. Avoid ibuprofen products for 2 weeks  ______________________________ Charlott Rakes, MD  CC:  n. eSIGNEDCharlott Rakes at 06/29/2011 01:37 PM  Dierdre Harness, [phone]

## 2011-08-27 ENCOUNTER — Encounter (INDEPENDENT_AMBULATORY_CARE_PROVIDER_SITE_OTHER): Payer: Self-pay | Admitting: Surgery

## 2011-08-27 ENCOUNTER — Ambulatory Visit (INDEPENDENT_AMBULATORY_CARE_PROVIDER_SITE_OTHER): Payer: Managed Care, Other (non HMO) | Admitting: Surgery

## 2011-08-27 VITALS — BP 152/84 | HR 70 | Temp 97.6°F | Resp 18 | Ht 66.0 in | Wt 164.0 lb

## 2011-08-27 DIAGNOSIS — K6282 Dysplasia of anus: Secondary | ICD-10-CM

## 2011-08-27 HISTORY — DX: Dysplasia of anus: K62.82

## 2011-08-27 NOTE — Patient Instructions (Signed)
Set up surgery for your anal mass that contains AIN

## 2011-08-27 NOTE — Progress Notes (Signed)
Subjective:     Patient ID: Cynthia Nunez, female   DOB: 1950-09-29, 61 y.o.   MRN: [id number]  HPI  Cynthia Nunez  03-02-1951 [id number]  Patient Care Team: Dorrene German, MD as PCP - General (Internal Medicine) Shirley Friar, MD as Consulting Physician (Gastroenterology)  This patient is a 61 y.o.female who presents today for surgical evaluation at the request of Dr. Bosie Clos.   Patient is a pleasant female. Heterosexual. Immunocompetent. No history of anal sex. Probable history of cervical changes requiring cryoablation in the past. Underwent colonoscopy followup. Was found to have abnormal mass in the anal canal. Biopsy showed low-grade anal intraepithelial neoplasia, AIN 1. She was sent to me for surgical evaluation. She had a history of hemorrhoids that intermittently prolapsed in the distant past. That was when she had chronic constipation. She is on a bowel regimen and moves her bowels daily now. No fevers or chills. Rather active. Rare rectal bleeding.  Patient Active Problem List  Diagnoses  . Personal history of colonic polyps  . Anal intraepithelial neoplasia I (AIN I), anterior midline    Past Medical History  Diagnosis Date  . GERD (gastroesophageal reflux disease)   . Constipation     chronic  . Herniated disc   . Blood dyscrasia     Idiopathic thrombocytopenia purpura  . Allergic rhinitis   . Bronchitis, chronic   . Skin cyst     righ tthigh  . Condyloma   . Hyperlipidemia   . Cough     Past Surgical History  Procedure Date  . Colonoscpy 2007  . Bunionectomy     both foot  . Hand surgery 2000 - approximate    left hand  . Colonoscopy 04/28/2011    Procedure: COLONOSCOPY;  Surgeon: Shirley Friar, MD;  Location: [facility];  Service: Endoscopy;  Laterality: N/A;    History   Social History  . Marital Status: Married    Spouse Name: N/A    Number of Children: N/A  . Years of Education: N/A   Occupational History  . Not on file.     Social History Main Topics  . Smoking status: Never Smoker   . Smokeless tobacco: Never Used  . Alcohol Use: No  . Drug Use: No  . Sexually Active: Not on file   Other Topics Concern  . Not on file   Social History Narrative  . No narrative on file    History reviewed. No pertinent family history.  No current outpatient prescriptions on file.     Allergies  Allergen Reactions  . Sudafed (Pseudoephedrine Hcl)     Passed out  . Miralax (Polyethylene Glycol)     constipation    BP 152/84  Pulse 70  Temp(Src) 97.6 F (36.4 C) (Temporal)  Resp 18  Ht 5\' 6"  (1.676 m)  Wt 164 lb (74.39 kg)  BMI 26.47 kg/m2     Review of Systems  Constitutional: Negative for fever, chills, diaphoresis, appetite change and fatigue.  HENT: Negative for ear pain, sore throat, trouble swallowing, neck pain and ear discharge.   Eyes: Negative for photophobia, discharge and visual disturbance.  Respiratory: Positive for cough. Negative for choking, chest tightness and shortness of breath.   Cardiovascular: Negative for chest pain and palpitations.  Gastrointestinal: Positive for constipation and anal bleeding. Negative for nausea, vomiting, abdominal pain, diarrhea, blood in stool and rectal pain.  Genitourinary: Negative for dysuria, frequency and difficulty urinating.  Musculoskeletal: Negative for myalgias  and gait problem.  Skin: Negative for color change, pallor and rash.  Neurological: Negative for dizziness, speech difficulty, weakness and numbness.  Hematological: Negative for adenopathy.  Psychiatric/Behavioral: Negative for confusion and agitation. The patient is not nervous/anxious.        Objective:   Physical Exam  Constitutional: She is oriented to person, place, and time. She appears well-developed and well-nourished. No distress.  HENT:  Head: Normocephalic.  Mouth/Throat: Oropharynx is clear and moist. No oropharyngeal exudate.  Eyes: Conjunctivae and EOM are  normal. Pupils are equal, round, and reactive to light. No scleral icterus.  Neck: Normal range of motion. Neck supple. No tracheal deviation present.  Cardiovascular: Normal rate, regular rhythm and intact distal pulses.   Pulmonary/Chest: Effort normal and breath sounds normal. No respiratory distress. She exhibits no tenderness.  Abdominal: Soft. She exhibits no distension and no mass. There is no tenderness. Hernia confirmed negative in the right inguinal area and confirmed negative in the left inguinal area.  Genitourinary: Vagina normal. No vaginal discharge found.       Perianal skin clean with good hygiene.  No pruritis.  No external skin tags / hemorrhoids of significance.  No pilonidal disease.  No fissure.  No abscess/fistula.    Tolerates digital and anoscopic rectal exam.  Normal sphincter tone.  No rectal masses.  Hemorrhoidal piles moderately enlarged.  Ant midline irregularity 1x1cm at most 2cm from anal verge at white line of [place]   Musculoskeletal: Normal range of motion. She exhibits no tenderness.  Lymphadenopathy:    She has no cervical adenopathy.       Right: No inguinal adenopathy present.       Left: No inguinal adenopathy present.  Neurological: She is alert and oriented to person, place, and time. No cranial nerve deficit. She exhibits normal muscle tone. Coordination normal.  Skin: Skin is warm and dry. No rash noted. She is not diaphoretic. No erythema.  Psychiatric: She has a normal mood and affect. Her behavior is normal. Judgment and thought content normal.       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  The pathophysiology of AIN, anorectal warts and differential diagnosis was discussed.  Natural history risks without surgery was discussed such as further growth and cancer.   I stressed the  importance of office follow-up to catch early recurrence & minimize/halt progression of disease.  Interventions such as cauterization by topical agents were discussed.  I feel the risks & problems of no intervention outweigh the operative risks; therefore, I recommended surgery to treat the anal lesion by removal.  Use 3% acetic acid to help with diagnosis / localization.  I would most likely leave the area open and allowed to heal by secondary intention.   I think the area is too proximal for laser ablation or fulguration in the office. Topical therapies to proximal as well. Because it is a focal lesion, I think excision for more definitive diagnosis will be of help as well.  Risks such as bleeding, infection, need for further treatment, heart attack, death, and other risks were discussed.   I noted a good likelihood this will help address the problem. Goals of post-operative recovery were discussed as well.  Possibility that this will not correct all symptoms was explained.  Post-operative pain, bleeding,  constipation, and other problems after surgery were discussed.  We will work to minimize complications.   Educational handouts further explaining the pathology, treatment options, and bowel regimen were given as well.  Questions were answered.  The patient expresses understanding & wishes to proceed with surgery.  She wished to delay until the summer. She did very delayed 3 months before coming to see me. I cautioned her and waiting too long; however, I think she can wait until June.

## 2011-09-09 ENCOUNTER — Telehealth (INDEPENDENT_AMBULATORY_CARE_PROVIDER_SITE_OTHER): Payer: Self-pay

## 2011-09-09 NOTE — Telephone Encounter (Signed)
Pt called to ask how long she could expect to be out of work after her anal surgery.  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

## 2011-10-18 ENCOUNTER — Encounter (HOSPITAL_COMMUNITY): Payer: Self-pay | Admitting: *Deleted

## 2011-10-18 DIAGNOSIS — D759 Disease of blood and blood-forming organs, unspecified: Secondary | ICD-10-CM

## 2011-10-18 DIAGNOSIS — R059 Cough, unspecified: Secondary | ICD-10-CM

## 2011-10-18 DIAGNOSIS — K219 Gastro-esophageal reflux disease without esophagitis: Secondary | ICD-10-CM

## 2011-10-18 DIAGNOSIS — IMO0002 Reserved for concepts with insufficient information to code with codable children: Secondary | ICD-10-CM

## 2011-10-18 DIAGNOSIS — L729 Follicular cyst of the skin and subcutaneous tissue, unspecified: Secondary | ICD-10-CM

## 2011-10-18 DIAGNOSIS — J42 Unspecified chronic bronchitis: Secondary | ICD-10-CM

## 2011-10-18 HISTORY — DX: Follicular cyst of the skin and subcutaneous tissue, unspecified: L72.9

## 2011-10-18 HISTORY — DX: Cough, unspecified: R05.9

## 2011-10-18 HISTORY — PX: BUNIONECTOMY: SHX129

## 2011-10-18 HISTORY — DX: Gastro-esophageal reflux disease without esophagitis: K21.9

## 2011-10-18 HISTORY — DX: Reserved for concepts with insufficient information to code with codable children: IMO0002

## 2011-10-18 HISTORY — DX: Disease of blood and blood-forming organs, unspecified: D75.9

## 2011-10-18 HISTORY — DX: Unspecified chronic bronchitis: J42

## 2011-10-18 HISTORY — PX: ABDOMINAL HYSTERECTOMY: SHX81

## 2011-10-20 ENCOUNTER — Encounter (HOSPITAL_COMMUNITY): Payer: Self-pay | Admitting: Pharmacy Technician

## 2011-10-21 MED ORDER — DEXTROSE 5 % IV SOLN
2.0000 g | INTRAVENOUS | Status: AC
  Administered 2011-10-22: 2 g via INTRAVENOUS
  Filled 2011-10-21: qty 2

## 2011-10-22 ENCOUNTER — Encounter (HOSPITAL_COMMUNITY): Payer: Self-pay | Admitting: Anesthesiology

## 2011-10-22 ENCOUNTER — Encounter (HOSPITAL_COMMUNITY): Payer: Self-pay | Admitting: *Deleted

## 2011-10-22 ENCOUNTER — Ambulatory Visit (HOSPITAL_COMMUNITY): Payer: Managed Care, Other (non HMO) | Admitting: Anesthesiology

## 2011-10-22 ENCOUNTER — Encounter (HOSPITAL_COMMUNITY)
Admission: RE | Disposition: A | Discharge status: Home / Self Care | Payer: Self-pay | Source: Ambulatory Visit | Attending: Surgery

## 2011-10-22 ENCOUNTER — Ambulatory Visit (HOSPITAL_COMMUNITY)
Admission: RE | Admit: 2011-10-22 | Discharge: 2011-10-22 | Disposition: A | Discharge status: Home / Self Care | Payer: Managed Care, Other (non HMO) | Source: Ambulatory Visit | Attending: Surgery | Admitting: Surgery

## 2011-10-22 DIAGNOSIS — K6282 Dysplasia of anus: Secondary | ICD-10-CM | POA: Diagnosis present

## 2011-10-22 DIAGNOSIS — Z79899 Other long term (current) drug therapy: Secondary | ICD-10-CM | POA: Insufficient documentation

## 2011-10-22 DIAGNOSIS — R05 Cough: Secondary | ICD-10-CM | POA: Insufficient documentation

## 2011-10-22 DIAGNOSIS — Z90710 Acquired absence of both cervix and uterus: Secondary | ICD-10-CM | POA: Insufficient documentation

## 2011-10-22 DIAGNOSIS — E785 Hyperlipidemia, unspecified: Secondary | ICD-10-CM | POA: Insufficient documentation

## 2011-10-22 DIAGNOSIS — A630 Anogenital (venereal) warts: Secondary | ICD-10-CM | POA: Insufficient documentation

## 2011-10-22 DIAGNOSIS — R059 Cough, unspecified: Secondary | ICD-10-CM | POA: Insufficient documentation

## 2011-10-22 DIAGNOSIS — K5900 Constipation, unspecified: Secondary | ICD-10-CM | POA: Insufficient documentation

## 2011-10-22 DIAGNOSIS — K219 Gastro-esophageal reflux disease without esophagitis: Secondary | ICD-10-CM | POA: Insufficient documentation

## 2011-10-22 HISTORY — PX: EXAMINATION UNDER ANESTHESIA: SHX1540

## 2011-10-22 LAB — CBC
HCT: 43.2 % (ref 36.0–46.0)
Hemoglobin: 14 g/dL (ref 12.0–15.0)
MCH: 26 pg (ref 26.0–34.0)
MCV: 80.3 fL (ref 78.0–100.0)
RBC: 5.38 MIL/uL — ABNORMAL HIGH (ref 3.87–5.11)

## 2011-10-22 SURGERY — EXCISION, MASS, RECTUM, ANAL APPROACH
Anesthesia: General | Site: Anus | Wound class: Contaminated

## 2011-10-22 MED ORDER — MUPIROCIN 2 % EX OINT
TOPICAL_OINTMENT | CUTANEOUS | Status: DC
Start: 2011-10-22 — End: 2011-10-22
  Filled 2011-10-22: qty 22

## 2011-10-22 MED ORDER — LACTATED RINGERS IV SOLN
INTRAVENOUS | Status: DC | PRN
  Administered 2011-10-22: 14:00:00 via INTRAVENOUS

## 2011-10-22 MED ORDER — SUCCINYLCHOLINE CHLORIDE 20 MG/ML IJ SOLN
INTRAMUSCULAR | Status: DC | PRN
  Administered 2011-10-22: 100 mg via INTRAVENOUS

## 2011-10-22 MED ORDER — CHLORHEXIDINE GLUCONATE 4 % EX LIQD: 1.0000 "application " | Freq: Once | CUTANEOUS | Status: DC

## 2011-10-22 MED ORDER — FENTANYL CITRATE 0.05 MG/ML IJ SOLN: 25.0000 ug | INTRAMUSCULAR | Status: DC | PRN

## 2011-10-22 MED ORDER — ACETAMINOPHEN 10 MG/ML IV SOLN
INTRAVENOUS | Status: DC | PRN
  Administered 2011-10-22: 1000 mg via INTRAVENOUS

## 2011-10-22 MED ORDER — SODIUM CHLORIDE 0.9 % IV SOLN: 250.0000 mL | INTRAVENOUS | Status: DC | PRN

## 2011-10-22 MED ORDER — ACETAMINOPHEN 325 MG PO TABS: 650.0000 mg | ORAL_TABLET | ORAL | Status: DC | PRN

## 2011-10-22 MED ORDER — SODIUM CHLORIDE 0.9 % IJ SOLN: 3.0000 mL | INTRAMUSCULAR | Status: DC | PRN

## 2011-10-22 MED ORDER — ONDANSETRON HCL 4 MG/2ML IJ SOLN
INTRAMUSCULAR | Status: DC | PRN
  Administered 2011-10-22: 4 mg via INTRAVENOUS

## 2011-10-22 MED ORDER — FENTANYL CITRATE 0.05 MG/ML IJ SOLN
INTRAMUSCULAR | Status: DC | PRN
  Administered 2011-10-22: 100 ug via INTRAVENOUS
  Administered 2011-10-22: 50 ug via INTRAVENOUS

## 2011-10-22 MED ORDER — LIDOCAINE HCL (CARDIAC) 20 MG/ML IV SOLN
INTRAVENOUS | Status: DC | PRN
  Administered 2011-10-22: 60 mg via INTRAVENOUS

## 2011-10-22 MED ORDER — ACETAMINOPHEN 650 MG RE SUPP: 650.0000 mg | RECTAL | Status: DC | PRN

## 2011-10-22 MED ORDER — ACETIC ACID 4% SOLUTION
1.0000 "application " | Freq: Once | Status: AC
  Administered 2011-10-22: 1 via TOPICAL
  Filled 2011-10-22 (×2): qty 240

## 2011-10-22 MED ORDER — MIDAZOLAM HCL 5 MG/5ML IJ SOLN
INTRAMUSCULAR | Status: DC | PRN
  Administered 2011-10-22: 2 mg via INTRAVENOUS

## 2011-10-22 MED ORDER — HYDROCORTISONE ACE-PRAMOXINE 2.5-1 % RE CREA: 1.0000 "application " | TOPICAL_CREAM | Freq: Four times a day (QID) | RECTAL | Status: DC | PRN

## 2011-10-22 MED ORDER — PROMETHAZINE HCL 25 MG/ML IJ SOLN
6.2500 mg | INTRAMUSCULAR | Status: DC | PRN
  Administered 2011-10-22: 6.25 mg via INTRAVENOUS

## 2011-10-22 MED ORDER — ONDANSETRON HCL 4 MG/2ML IJ SOLN: 4.0000 mg | Freq: Four times a day (QID) | INTRAMUSCULAR | Status: DC | PRN

## 2011-10-22 MED ORDER — NAPROXEN 500 MG PO TABS: 500.0000 mg | ORAL_TABLET | Freq: Two times a day (BID) | ORAL | Status: DC

## 2011-10-22 MED ORDER — ACETAMINOPHEN 10 MG/ML IV SOLN
INTRAVENOUS | Status: AC
  Filled 2011-10-22: qty 100

## 2011-10-22 MED ORDER — OXYCODONE HCL 5 MG PO TABS: 5.0000 mg | ORAL_TABLET | ORAL | Status: DC | PRN

## 2011-10-22 MED ORDER — LACTATED RINGERS IV SOLN: INTRAVENOUS | Status: DC

## 2011-10-22 MED ORDER — BUPIVACAINE LIPOSOME 1.3 % IJ SUSP
20.0000 mL | Freq: Once | INTRAMUSCULAR | Status: AC
  Administered 2011-10-22: 20 mL
  Filled 2011-10-22: qty 20

## 2011-10-22 MED ORDER — LACTATED RINGERS IV SOLN
INTRAVENOUS | Status: DC
  Administered 2011-10-22: 1000 mL via INTRAVENOUS

## 2011-10-22 MED ORDER — OXYCODONE HCL 5 MG PO TABS: 5.0000 mg | ORAL_TABLET | ORAL | Status: AC | PRN

## 2011-10-22 MED ORDER — FENTANYL CITRATE 0.05 MG/ML IJ SOLN
INTRAMUSCULAR | Status: AC
  Filled 2011-10-22: qty 2

## 2011-10-22 MED ORDER — SODIUM CHLORIDE 0.9 % IJ SOLN: 3.0000 mL | Freq: Two times a day (BID) | INTRAMUSCULAR | Status: DC

## 2011-10-22 MED ORDER — FENTANYL CITRATE 0.05 MG/ML IJ SOLN
25.0000 ug | INTRAMUSCULAR | Status: DC | PRN
  Administered 2011-10-22 (×2): 50 ug via INTRAVENOUS

## 2011-10-22 MED ORDER — PROPOFOL 10 MG/ML IV EMUL
INTRAVENOUS | Status: DC | PRN
  Administered 2011-10-22: 40 mg via INTRAVENOUS
  Administered 2011-10-22: 160 mg via INTRAVENOUS

## 2011-10-22 MED ORDER — PROMETHAZINE HCL 25 MG/ML IJ SOLN
INTRAMUSCULAR | Status: AC
  Administered 2011-10-22: 6.25 mg via INTRAVENOUS
  Filled 2011-10-22: qty 1

## 2011-10-22 MED ORDER — CEFOXITIN SODIUM-DEXTROSE 1-4 GM-% IV SOLR (PREMIX)
INTRAVENOUS | Status: AC
  Filled 2011-10-22: qty 100

## 2011-10-22 SURGICAL SUPPLY — 32 items
BLADE HEX COATED 2.75 (ELECTRODE) ×2 IMPLANT
BLADE SURG 15 STRL LF DISP TIS (BLADE) ×2 IMPLANT
BLADE SURG 15 STRL SS (BLADE) ×4
CANISTER SUCTION 2500CC (MISCELLANEOUS) ×2 IMPLANT
CLOTH BEACON ORANGE TIMEOUT ST (SAFETY) ×2 IMPLANT
DECANTER SPIKE VIAL GLASS SM (MISCELLANEOUS) ×2 IMPLANT
DRAPE LAPAROTOMY T 102X78X121 (DRAPES) ×1 IMPLANT
DRSG PAD ABDOMINAL 8X10 ST (GAUZE/BANDAGES/DRESSINGS) ×1 IMPLANT
ELECT REM PT RETURN 9FT ADLT (ELECTROSURGICAL) ×2
ELECTRODE REM PT RTRN 9FT ADLT (ELECTROSURGICAL) ×1 IMPLANT
GAUZE SPONGE 4X4 16PLY XRAY LF (GAUZE/BANDAGES/DRESSINGS) ×2 IMPLANT
GLOVE BIOGEL PI IND STRL 7.0 (GLOVE) ×1 IMPLANT
GLOVE BIOGEL PI INDICATOR 7.0 (GLOVE) ×1
GLOVE ECLIPSE 8.0 STRL XLNG CF (GLOVE) ×2 IMPLANT
GLOVE INDICATOR 8.0 STRL GRN (GLOVE) ×2 IMPLANT
GOWN STRL NON-REIN LRG LVL3 (GOWN DISPOSABLE) ×2 IMPLANT
GOWN STRL REIN XL XLG (GOWN DISPOSABLE) ×4 IMPLANT
KIT BASIN OR (CUSTOM PROCEDURE TRAY) ×2 IMPLANT
NEEDLE HYPO 22GX1.5 SAFETY (NEEDLE) ×2 IMPLANT
PACK BASIC VI WITH GOWN DISP (CUSTOM PROCEDURE TRAY) ×2 IMPLANT
PENCIL BUTTON HOLSTER BLD 10FT (ELECTRODE) ×2 IMPLANT
SPONGE GAUZE 4X4 12PLY (GAUZE/BANDAGES/DRESSINGS) ×2 IMPLANT
SPONGE SURGIFOAM ABS GEL 12-7 (HEMOSTASIS) IMPLANT
SUT CHROMIC 2 0 SH (SUTURE) IMPLANT
SUT CHROMIC 3 0 SH 27 (SUTURE) IMPLANT
SUT SILK 2 0 SH (SUTURE) ×1 IMPLANT
SUT VIC AB 2-0 SH 27 (SUTURE)
SUT VIC AB 2-0 SH 27X BRD (SUTURE) IMPLANT
SUT VIC AB 2-0 UR6 27 (SUTURE) ×3 IMPLANT
SUT VIC AB 4-0 SH 18 (SUTURE) IMPLANT
TOWEL OR 17X26 10 PK STRL BLUE (TOWEL DISPOSABLE) ×2 IMPLANT
YANKAUER SUCT BULB TIP 10FT TU (MISCELLANEOUS) ×2 IMPLANT

## 2011-10-22 NOTE — Anesthesia Postprocedure Evaluation (Signed)
Anesthesia Post Note  Patient: Cynthia Nunez  Procedure(s) Performed: Procedure(s) (LRB): TRANSANAL EXCISION OF RECTAL MASS (N/A) EXAM UNDER ANESTHESIA (N/A)  Anesthesia type: General  Patient location: PACU  Post pain: Pain level controlled  Post assessment: Post-op Vital signs reviewed  Last Vitals:  Filed Vitals:   10/22/11 1715  BP: 148/85  Pulse: 54  Temp:   Resp: 17    Post vital signs: Reviewed  Level of consciousness: sedated  Complications: No apparent anesthesia complications

## 2011-10-22 NOTE — Transfer of Care (Signed)
Immediate Anesthesia Transfer of Care Note  Patient: Cynthia Nunez  Procedure(s) Performed: Procedure(s) (LRB): TRANSANAL EXCISION OF RECTAL MASS (N/A) EXAM UNDER ANESTHESIA (N/A)  Patient Location: PACU  Anesthesia Type: General  Level of Consciousness: awake, alert , oriented and patient cooperative  Airway & Oxygen Therapy: Patient Spontanous Breathing and Patient connected to face mask oxygen  Post-op Assessment: Report given to PACU RN, Post -op Vital signs reviewed and stable and Patient moving all extremities X 4  Post vital signs: Reviewed and stable  Complications: No apparent anesthesia complications

## 2011-10-22 NOTE — Discharge Instructions (Signed)
ANORECTAL SURGERY: POST OP INSTRUCTIONS  1. Take your usually prescribed home medications unless otherwise directed. 2. DIET: Follow a light bland diet the first 24 hours after arrival home, such as soup, liquids, crackers, etc.  Be sure to include lots of fluids daily.  Avoid fast food or heavy meals as your are more likely to get nauseated.  Eat a low fat the next few days after surgery.   3. PAIN CONTROL: a. Pain is best controlled by a usual combination of three different methods TOGETHER: i. Ice/Heat ii. Over the counter pain medication iii. Prescription pain medication b. Most patients will experience some swelling and discomfort in the anus/rectal area. and incisions.  Ice packs or heat (30-60 minutes up to 6 times a day) will help. Use ice for the first few days to help decrease swelling and bruising, then switch to heat such as warm towels, sitz baths, warm baths, etc to help relax tight/sore spots and speed recovery.  Some people prefer to use ice alone, heat alone, alternating between ice & heat.  Experiment to what works for you.  Swelling and bruising can take several weeks to resolve.   c. It is helpful to take an over-the-counter pain medication regularly for the first few weeks.  Choose one of the following that works best for you: i. Naproxen (Aleve, etc)  Two 220mg tabs twice a day ii. Ibuprofen (Advil, etc) Three 200mg tabs four times a day (every meal & bedtime) iii. Acetaminophen (Tylenol, etc) 500-650mg four times a day (every meal & bedtime) d. A  prescription for pain medication (such as oxycodone, hydrocodone, etc) should be given to you upon discharge.  Take your pain medication as prescribed.  i. If you are having problems/concerns with the prescription medicine (does not control pain, nausea, vomiting, rash, itching, etc), please call us [phone] to see if we need to switch you to a different pain medicine that will work better for you and/or control your side effect  better. ii. If you need a refill on your pain medication, please contact your pharmacy.  They will contact our office to request authorization. Prescriptions will not be filled after 5 pm or on week-ends. 4. KEEP YOUR BOWELS REGULAR a. The goal is one bowel movement a day b. Avoid getting constipated.  Between the surgery and the pain medications, it is common to experience some constipation.  Increasing fluid intake and taking a fiber supplement (such as Metamucil, Citrucel, FiberCon, MiraLax, etc) 1-2 times a day regularly will usually help prevent this problem from occurring.  A mild laxative (prune juice, Milk of Magnesia, MiraLax, etc) should be taken according to package directions if there are no bowel movements after 48 hours. c. Watch out for diarrhea.  If you have many loose bowel movements, simplify your diet to bland foods & liquids for a few days.  Stop any stool softeners and decrease your fiber supplement.  Switching to mild anti-diarrheal medications (Kayopectate, Pepto Bismol) can help.  If this worsens or does not improve, please call us.  5. Wound Care a. Remove your bandages the day after surgery.  Unless discharge instructions indicate otherwise, leave your bandage dry and in place overnight.  Remove the bandage during your first bowel movement.   b. Allow the wound packing to fall out over the next few days.  You can trim exposed gauze / ribbon as it falls out.  You do not need to repack the wound unless instructed otherwise.  Wear an   absorbent pad or soft cotton gauze in your underwear as needed to catch any drainage and help keep the area  c. Keep the area clean and dry.  Bathe / shower every day.  Keep the area clean by showering / bathing over the incision / wound.   It is okay to soak an open wound to help wash it.  Wet wipes or showers / gentle washing after bowel movements is often less traumatic than regular toilet paper. d. You may have some styrofoam-like soft packing in  the rectum which will come out with the first bowel movement.  e. You will often notice bleeding with bowel movements.  This should slow down by the end of the first week of surgery f. Expect some drainage.  This should slow down, too, by the end of the first week of surgery.  Wear an absorbent pad or soft cotton gauze in your underwear until the drainage stops. 6. ACTIVITIES as tolerated:   a. You may resume regular (light) daily activities beginning the next day--such as daily self-care, walking, climbing stairs--gradually increasing activities as tolerated.  If you can walk 30 minutes without difficulty, it is safe to try more intense activity such as jogging, treadmill, bicycling, low-impact aerobics, swimming, etc. b. Save the most intensive and strenuous activity for last such as sit-ups, heavy lifting, contact sports, etc  Refrain from any heavy lifting or straining until you are off narcotics for pain control.   c. DO NOT PUSH THROUGH PAIN.  Let pain be your guide: If it hurts to do something, don't do it.  Pain is your body warning you to avoid that activity for another week until the pain goes down. d. You may drive when you are no longer taking prescription pain medication, you can comfortably sit for long periods of time, and you can safely maneuver your car and apply brakes. e. You may have sexual intercourse when it is comfortable.  7. FOLLOW UP in our office a. Please call [facility] at [phone] to set up an appointment to see your surgeon in the office for a follow-up appointment approximately 2 weeks after your surgery. b. Make sure that you call for this appointment the day you arrive home to insure a convenient appointment time. 10. IF YOU HAVE DISABILITY OR FAMILY LEAVE FORMS, BRING THEM TO THE OFFICE FOR PROCESSING.  DO NOT GIVE THEM TO YOUR DOCTOR.        WHEN TO CALL US [phone]: 1. Poor pain control 2. Reactions / problems with new medications (rash/itching, nausea,  etc)  3. Fever over 101.5 F (38.5 C) 4. Inability to urinate 5. Nausea and/or vomiting 6. Worsening swelling or bruising 7. Continued bleeding from incision. 8. Increased pain, redness, or drainage from the incision  The clinic staff is available to answer your questions during regular business hours (8:30am-5pm).  Please don't hesitate to call and ask to speak to one of our nurses for clinical concerns.   A surgeon from [facility] is always on call at the hospitals   If you have a medical emergency, go to the nearest emergency room or call 911.    [facility] [address], [address], [place], [place]  [address] ? MAIN: [phone] ? TOLL FREE: [phone] ? FAX [phone] [email or web address]    

## 2011-10-22 NOTE — Anesthesia Preprocedure Evaluation (Addendum)
Anesthesia Evaluation  Patient identified by MRN, date of birth, ID band Patient awake    Reviewed: Allergy & Precautions, H&P , NPO status , Patient's Chart, lab work & pertinent test results, reviewed documented beta blocker date and time   Airway Mallampati: II TM Distance: >3 FB Neck ROM: full    Dental No notable dental hx. 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   Anesthesia Other Findings   Reproductive/Obstetrics negative OB ROS                           Anesthesia Physical Anesthesia Plan  ASA: II  Anesthesia Plan: General   Post-op Pain Management:    Induction: Intravenous  Airway Management Planned: Oral ETT  Additional Equipment:   Intra-op Plan:   Post-operative Plan: Extubation in OR  Informed Consent: I have reviewed the patients History and Physical, chart, labs and discussed the procedure including the risks, benefits and alternatives for the proposed anesthesia with the patient or authorized representative who has indicated his/her understanding and acceptance.   Dental advisory given  Plan Discussed with: CRNA  Anesthesia Plan Comments:         Anesthesia Quick Evaluation

## 2011-10-22 NOTE — H&P (Signed)
Cynthia Nunez  [birth year] [id number]  Patient Care Team: Dorrene German, MD as PCP - General (Internal Medicine) Shirley Friar, MD as Consulting Physician (Gastroenterology)  This patient is a 61 y.o.female who presents today for surgical evaluation.   Patient is a pleasant female. Heterosexual. Immunocompetent. No history of anal sex. Probable history of cervical changes requiring cryoablation in the past. Underwent colonoscopy followup. Was found to have abnormal mass in the anal canal. Biopsy showed low-grade anal intraepithelial neoplasia, AIN 1. She was sent to me for surgical evaluation. She had a history of hemorrhoids that intermittently prolapsed in the distant past. That was when she had chronic constipation. She is on a bowel regimen and moves her bowels daily now. No fevers or chills. Rather active. Rare rectal bleeding.  No new events  Patient Active Problem List  Diagnosis  . Personal history of colonic polyps  . Anal intraepithelial neoplasia I (AIN I), anterior midline    Past Medical History  Diagnosis Date  . Constipation 10-18-11    chronic- is much improved with diet changes  . Herniated disc 10-18-11    Lumbar disc problem-no problems at  present time.  . Allergic rhinitis 10-18-11    no longer a problem  . Bronchitis, chronic 10-18-11    no recent problems  . Skin cyst 10-18-11    right  thigh-posterior near bend of knee  . Condyloma   . Hyperlipidemia   . Cough 10-18-11    not at this time  . GERD (gastroesophageal reflux disease) 10-18-11    controls with diet-rare occ.  Marland Kitchen Blood dyscrasia 10-18-11    Idiopathic thrombocytopenia purpura-no recent problems    Past Surgical History  Procedure Date  . Colonoscpy 2007  . Bunionectomy 10-18-11    both foot-retained pins  . Hand surgery 2000 - approximate    left hand-fracture repair  . Colonoscopy 04/28/2011    Procedure: COLONOSCOPY;  Surgeon: Shirley Friar, MD;  Location: [facility];   Service: Endoscopy;  Laterality: N/A;  . Abdominal hysterectomy 10-18-11    uterus removed and one ovary removed    History   Social History  . Marital Status: Married    Spouse Name: N/A    Number of Children: N/A  . Years of Education: N/A   Occupational History  . Not on file.   Social History Main Topics  . Smoking status: Never Smoker   . Smokeless tobacco: Never Used  . Alcohol Use: Yes     occ. to rare social  . Drug Use: No  . Sexually Active: Yes   Other Topics Concern  . Not on file   Social History Narrative  . No narrative on file    History reviewed. No pertinent family history.  Current Facility-Administered Medications  Medication Dose Route Frequency Provider Last Rate Last Dose  . acetic acid 4 % topical solution 1 application  1 application Topical Once Ardeth Sportsman, MD      . bupivacaine liposome (EXPAREL) 1.3 % injection 266 mg  20 mL Infiltration Once Ardeth Sportsman, MD      . cefOXitin (MEFOXIN) 2 g in dextrose 5 % 50 mL IVPB  2 g Intravenous 60 min Pre-Op Ardeth Sportsman, MD      . chlorhexidine (HIBICLENS) 4 % liquid 1 application  1 application Topical Once Ardeth Sportsman, MD      . lactated ringers infusion   Intravenous Continuous Phillips Grout, MD 125 mL/hr at  10/22/11 1422 1,000 mL at 10/22/11 1422  . mupirocin ointment (BACTROBAN) 2 %           . DISCONTD: chlorhexidine (HIBICLENS) 4 % liquid 1 application  1 application Topical Once Ardeth Sportsman, MD         Allergies  Allergen Reactions  . Sudafed (Pseudoephedrine Hcl)     Passed out  . Miralax (Polyethylene Glycol)     constipation    BP 149/96  Pulse 64  Temp 98.3 F (36.8 C) (Oral)  Resp 18  Ht 5\' 6"  (1.676 m)  Wt 157 lb 6 oz (71.385 kg)  BMI 25.40 kg/m2  SpO2 99%  No results found.  Review of Systems  Constitutional: Negative for fever, chills, diaphoresis, appetite change and fatigue.  HENT: Negative for ear pain, sore throat, trouble swallowing, neck pain  and ear discharge.  Eyes: Negative for photophobia, discharge and visual disturbance.  Respiratory: Positive for cough. Negative for choking, chest tightness and shortness of breath.  Cardiovascular: Negative for chest pain and palpitations.  Gastrointestinal: Positive for constipation and anal bleeding. Negative for nausea, vomiting, abdominal pain, diarrhea, blood in stool and rectal pain.  Genitourinary: Negative for dysuria, frequency and difficulty urinating.  Musculoskeletal: Negative for myalgias and gait problem.  Skin: Negative for color change, pallor and rash.  Neurological: Negative for dizziness, speech difficulty, weakness and numbness.  Hematological: Negative for adenopathy.  Psychiatric/Behavioral: Negative for confusion and agitation. The patient is not nervous/anxious.  Objective:   Physical Exam  Constitutional: She is oriented to person, place, and time. She appears well-developed and well-nourished. No distress.  HENT:  Head: Normocephalic.  Mouth/Throat: Oropharynx is clear and moist. No oropharyngeal exudate.  Eyes: Conjunctivae and EOM are normal. Pupils are equal, round, and reactive to light. No scleral icterus.  Neck: Normal range of motion. Neck supple. No tracheal deviation present.  Cardiovascular: Normal rate, regular rhythm and intact distal pulses.  Pulmonary/Chest: Effort normal and breath sounds normal. No respiratory distress. She exhibits no tenderness.  Abdominal: Soft. She exhibits no distension and no mass. There is no tenderness. Hernia confirmed negative in the right inguinal area and confirmed negative in the left inguinal area.  Genitourinary: Vagina normal. No vaginal discharge found.  Musculoskeletal: Normal range of motion. She exhibits no tenderness.  Lymphadenopathy:  She has no cervical adenopathy.  Right: No inguinal adenopathy present.  Left: No inguinal adenopathy present.  Neurological: She is alert and oriented to person, place, and  time. No cranial nerve deficit. She exhibits normal muscle tone. Coordination normal.  Skin: Skin is warm and dry. No rash noted. She is not diaphoretic. No erythema.  Psychiatric: She has a normal mood and affect. Her behavior is normal. Judgment and thought content normal.   Assessment:   Anal canal mass, solitary with probable AIN1. Probable h/o Cervical IN (?HPV exposure) but otherwise low risk   Plan:    EUA with removal of AIN1 region anal canal.  Use 3% acetic acid to help with diagnosis / localization.  The anatomy & physiology of the anorectal region was discussed. The pathophysiology of AIN, anorectal warts and differential diagnosis was discussed. Natural history risks without surgery was discussed such as further growth and cancer. I stressed the importance of office follow-up to catch early recurrence & minimize/halt progression of disease. Interventions such as cauterization by topical agents were discussed.  I feel the risks & problems of no intervention outweigh the operative risks; therefore, I recommended surgery to treat  the anal lesion by removal.  I would most likely leave the area open and allowed to heal by secondary intention. I think the area is too proximal for laser ablation or fulguration in the office. Topical therapies to proximal as well. Because it is a focal lesion, I think excision for more definitive diagnosis will be of help as well.  Risks such as bleeding, infection, need for further treatment, heart attack, death, and other risks were discussed. I noted a good likelihood this will help address the problem. Goals of post-operative recovery were discussed as well. Possibility that this will not correct all symptoms was explained. Post-operative pain, bleeding, constipation, and other problems after surgery were discussed. We will work to minimize complications. Educational handouts further explaining the pathology, treatment options, and bowel regimen were given as  well. Questions were answered. The patient expresses understanding & wishes to proceed with surgery. She wished to delay until the summer. She did very delayed 3 months before coming to see me. I cautioned her and waiting too long; however, I think she can wait until June.

## 2011-10-22 NOTE — Op Note (Signed)
10/22/2011  4:55 PM  PATIENT:  Cynthia Nunez  61 y.o. female  Patient Care Team: Dorrene German, MD as PCP - General (Internal Medicine) Shirley Friar, MD as Consulting Physician (Gastroenterology)  PRE-OPERATIVE DIAGNOSIS:  Anal Intraepithelial Neoplasia on anal lesion  POST-OPERATIVE DIAGNOSIS:  Anal Intraepithelial Neoplasia on anal lesion  PROCEDURE:  Procedure(s): TRANSANAL EXCISION OF RECTAL MASS EXAM UNDER ANESTHESIA  SURGEON:  Surgeon(s): Ardeth Sportsman, MD  ASSISTANT: none   ANESTHESIA:   local and general  EBL:  Total I/O In: -  Out: 20 [Blood:20]  Delay start of Pharmacological VTE agent (>24hrs) due to surgical blood loss or risk of bleeding:  no  DRAINS: none   SPECIMEN:  Source of Specimen:  Anal canal lesion (silk at distal margin)  DISPOSITION OF SPECIMEN:  PATHOLOGY  COUNTS:  YES  PLAN OF CARE: Discharge to home after PACU  PATIENT DISPOSITION:  PACU - hemodynamically stable.  INDICATION: Pleasant female with no prior history of anorectal problems. Found to have an anal canal mass. Biopsy showed AIN-1. She was sent to make. Because it was a localized solitary lesion, I recommended excision:   The anatomy & physiology of the anorectal region was discussed.  The pathophysiology of anorectal masses and differential diagnosis was discussed.  Natural history risks without surgery was discussed such as further growth and cancer.   I stressed the importance of office follow-up to catch early recurrence & minimize/halt progression of disease.  Interventions such as cauterization by topical agents were discussed.  The patient's symptoms are not adequately controlled by non-operative treatments.  I feel the risks & problems of no surgery outweigh the operative risks; therefore, I recommended surgery to treat the anal warts by removal, ablation and/or cauterization.  Risks such as bleeding, infection, need for further treatment, heart attack, death, and  other risks were discussed.   I noted a good likelihood this will help address the problem. Goals of post-operative recovery were discussed as well.  Possibility that this will not correct all symptoms was explained.  Post-operative pain, bleeding, constipation, and other problems after surgery were discussed.  We will work to minimize complications.   Educational handouts further explaining the pathology, treatment options, and bowel regimen were given as well.  Questions were answered.  The patient expresses understanding & wishes to proceed with surgery.  OR FINDINGS: 1 cm anterior midline anal canal sessile mass. Lites up with acetic acid. No other anal canal or rectal abnormalities  DESCRIPTION: Informed consent was confirmed. Patient underwent general anesthesia without difficulty. She was positioned prone. Her perianal region was retracted prepped and draped in sterile fashion. She received IV antibiotics. Surgical tunnel confirmed or plan.  I did digital rectal examination and could feel the anterior midline mass only. Anoscopy to confirm no other abnormalities in the anal canal rectal canal. No evidence of any fissure or colitis or proctitis. No evidence of abscess. No significantly enlarged hemorrhoids.  I marked 1 cm margins around the anal canal mass. I did a full-thickness excision of the anal canal mass circumferentially using cautery & sharp dissection. A 3-0 silk stitch marked the distal end. I assured hemostasis.  I reapproximated the wound transversely using interrupted horizontal mattress 2-0 Vicryl stitches to good result. Hemostasis was ensured. Liposomal bupivacaine anorectal block was confirmed. The  patient was extubated & sent to the PACU in stable condition. I had discussed postop care in detail the patient in the office & again in the pre-op holding  area. Instructions are written. I discussed with her husband as well. He expressed understanding and  appreciation.

## 2011-10-22 NOTE — Progress Notes (Signed)
Pt has taken Mag Citrate and clear liq x 2 days

## 2011-10-22 NOTE — Preoperative (Signed)
Beta Blockers   Reason not to administer Beta Blockers:Not Applicable 

## 2011-10-25 ENCOUNTER — Telehealth (INDEPENDENT_AMBULATORY_CARE_PROVIDER_SITE_OTHER): Payer: Self-pay | Admitting: General Surgery

## 2011-10-25 NOTE — Telephone Encounter (Signed)
Pt calling to let Dr. Michaell Cowing know she has not yet had a bowel movement.  She is passing gas regularly and is taking Colace, too.  Dr. Michaell Cowing had expressed she should have a BM by today at the latest.  FYI.

## 2011-10-25 NOTE — Telephone Encounter (Signed)
Please tell her to try a laxative (Miralax/MOM) to help her have a BM.  Take FIBER supplement such as citrucel/metamucil/miralax - works better than a "stool softener"

## 2011-10-26 ENCOUNTER — Encounter (HOSPITAL_COMMUNITY): Payer: Self-pay | Admitting: Surgery

## 2011-11-08 ENCOUNTER — Encounter (INDEPENDENT_AMBULATORY_CARE_PROVIDER_SITE_OTHER): Payer: Self-pay | Admitting: Surgery

## 2011-11-08 ENCOUNTER — Ambulatory Visit (INDEPENDENT_AMBULATORY_CARE_PROVIDER_SITE_OTHER): Payer: Managed Care, Other (non HMO) | Admitting: Surgery

## 2011-11-08 VITALS — BP 108/82 | HR 57 | Temp 98.2°F | Ht 66.0 in | Wt 162.8 lb

## 2011-11-08 DIAGNOSIS — K6282 Dysplasia of anus: Secondary | ICD-10-CM

## 2011-11-08 NOTE — Patient Instructions (Addendum)
See the Handout(s) we gave you.  Please call our office at [phone] if you wish to schedule surgery or if you have further questions / concerns.   

## 2011-11-08 NOTE — Progress Notes (Signed)
Subjective:     Patient ID: Cynthia Nunez, female   DOB: Jul 10, 1950, 61 y.o.   MRN: [id number]  HPI  Cynthia Nunez  May 06, 1951 [id number]  Patient Care Team: Dorrene German, MD as PCP - General (Internal Medicine) Shirley Friar, MD as Consulting Physician (Gastroenterology)  This patient is a 61 y.o.female who presents today for surgical evaluation.   Procedure: Removal of anal canal mass 10/22/2011  Pathology: Anal intraepithelial neoplasia one area margins negative.  The patient comes in today feeling fine.  Mild rectal bleeding fading away.  Soreness fading away.  Using sitz bath.  Had constipation initially but now having daily regular bowel movements.  In good spirits.  No fevers or chills.  No rectal drainage or pus.  Patient Active Problem List  Diagnosis  . Personal history of colonic polyps  . Anal intraepithelial neoplasia I (AIN I), anterior midline    Past Medical History  Diagnosis Date  . Constipation 10-18-11    chronic- is much improved with diet changes  . Herniated disc 10-18-11    Lumbar disc problem-no problems at  present time.  . Allergic rhinitis 10-18-11    no longer a problem  . Bronchitis, chronic 10-18-11    no recent problems  . Skin cyst 10-18-11    right  thigh-posterior near bend of knee  . Condyloma   . Hyperlipidemia   . Cough 10-18-11    not at this time  . GERD (gastroesophageal reflux disease) 10-18-11    controls with diet-rare occ.  Marland Kitchen Blood dyscrasia 10-18-11    Idiopathic thrombocytopenia purpura-no recent problems    Past Surgical History  Procedure Date  . Colonoscpy 2007  . Bunionectomy 10-18-11    both foot-retained pins  . Hand surgery 2000 - approximate    left hand-fracture repair  . Colonoscopy 04/28/2011    Procedure: COLONOSCOPY;  Surgeon: Shirley Friar, MD;  Location: [facility];  Service: Endoscopy;  Laterality: N/A;  . Abdominal hysterectomy 10-18-11    uterus removed and one ovary removed  .  Examination under anesthesia 10/22/2011    Procedure: EXAM UNDER ANESTHESIA;  Surgeon: Cynthia Sportsman, MD;  Location: [facility];  Service: General;  Laterality: N/A;    History   Social History  . Marital Status: Married    Spouse Name: N/A    Number of Children: N/A  . Years of Education: N/A   Occupational History  . Not on file.   Social History Main Topics  . Smoking status: Never Smoker   . Smokeless tobacco: Never Used  . Alcohol Use: Yes     occ. to rare social  . Drug Use: No  . Sexually Active: Yes   Other Topics Concern  . Not on file   Social History Narrative  . No narrative on file    History reviewed. No pertinent family history.  Current Outpatient Prescriptions  Medication Sig Dispense Refill  . ibuprofen (ADVIL,MOTRIN) 200 MG tablet Take 400 mg by mouth every 6 (six) hours as needed. pain         Allergies  Allergen Reactions  . Sudafed (Pseudoephedrine Hcl)     Passed out  . Miralax (Polyethylene Glycol)     constipation    BP 108/82  Pulse 57  Temp 98.2 F (36.8 C) (Temporal)  Ht 5\' 6"  (1.676 m)  Wt 162 lb 12.8 oz (73.846 kg)  BMI 26.28 kg/m2  SpO2 98%  No results found.  Review of  Systems  Constitutional: Negative for fever, chills and diaphoresis.  HENT: Negative for ear pain, sore throat and trouble swallowing.   Eyes: Negative for photophobia and visual disturbance.  Respiratory: Negative for cough and choking.   Cardiovascular: Negative for chest pain and palpitations.  Gastrointestinal: Positive for anal bleeding. Negative for nausea, vomiting, abdominal pain, diarrhea, constipation, blood in stool and rectal pain.  Genitourinary: Negative for dysuria, frequency and difficulty urinating.  Musculoskeletal: Negative for myalgias and gait problem.  Skin: Negative for color change, pallor and rash.  Neurological: Negative for dizziness, speech difficulty, weakness and numbness.  Hematological: Negative for adenopathy.    Psychiatric/Behavioral: Negative for confusion and agitation. The patient is not nervous/anxious.        Objective:   Physical Exam  Constitutional: She is oriented to person, place, and time. She appears well-developed and well-nourished. No distress.  HENT:  Head: Normocephalic.  Mouth/Throat: Oropharynx is clear and moist. No oropharyngeal exudate.  Eyes: Conjunctivae and EOM are normal. Pupils are equal, round, and reactive to light. No scleral icterus.  Neck: Normal range of motion. No tracheal deviation present.  Cardiovascular: Normal rate and intact distal pulses.   Pulmonary/Chest: Effort normal. No respiratory distress. She exhibits no tenderness.  Abdominal: Soft. She exhibits no distension. There is no tenderness. Hernia confirmed negative in the right inguinal area and confirmed negative in the left inguinal area.       Incisions clean with normal healing ridges.  No hernias  Genitourinary: No vaginal discharge found.       Exam done with assistance of female Medical Assistant in the room.  Perianal skin clean with good hygiene.  No pruritis.  No external skin tags / hemorrhoids of significance.  No pilonidal disease.  No fissure.  No abscess/fistula.    Normal sphincter tone.    Musculoskeletal: Normal range of motion. She exhibits no tenderness.  Lymphadenopathy:       Right: No inguinal adenopathy present.       Left: No inguinal adenopathy present.  Neurological: She is alert and oriented to person, place, and time. No cranial nerve deficit. She exhibits normal muscle tone. Coordination normal.  Skin: Skin is warm and dry. No rash noted. She is not diaphoretic.  Psychiatric: She has a normal mood and affect. Her behavior is normal.       Assessment:     S/p excision of anal AIN1 solitary mass in immunocompetent female    Plan:     Increase activity as tolerated.  Do not push through pain.  Advanced on diet as tolerated. Bowel regimen to avoid  problems.  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  The importance of barrier birth control.  Noted the importance of following up to catch & control any recurrence early.  Questions were answered. The patient expressed understanding and appreciation

## 2012-02-04 ENCOUNTER — Encounter (INDEPENDENT_AMBULATORY_CARE_PROVIDER_SITE_OTHER): Payer: Managed Care, Other (non HMO) | Admitting: Surgery

## 2012-02-07 ENCOUNTER — Encounter: Payer: Self-pay | Admitting: Gynecology

## 2012-02-07 ENCOUNTER — Ambulatory Visit (INDEPENDENT_AMBULATORY_CARE_PROVIDER_SITE_OTHER): Payer: Managed Care, Other (non HMO) | Admitting: Gynecology

## 2012-02-07 ENCOUNTER — Other Ambulatory Visit (HOSPITAL_COMMUNITY)
Admission: RE | Admit: 2012-02-07 | Discharge: 2012-02-07 | Disposition: A | Discharge status: Home / Self Care | Payer: Managed Care, Other (non HMO) | Source: Ambulatory Visit | Attending: Gynecology | Admitting: Gynecology

## 2012-02-07 ENCOUNTER — Ambulatory Visit: Payer: Managed Care, Other (non HMO) | Admitting: Gynecology

## 2012-02-07 VITALS — BP 122/82 | Ht 66.0 in | Wt 166.0 lb

## 2012-02-07 DIAGNOSIS — D693 Immune thrombocytopenic purpura: Secondary | ICD-10-CM | POA: Insufficient documentation

## 2012-02-07 DIAGNOSIS — C801 Malignant (primary) neoplasm, unspecified: Secondary | ICD-10-CM | POA: Insufficient documentation

## 2012-02-07 DIAGNOSIS — Z13220 Encounter for screening for lipoid disorders: Secondary | ICD-10-CM

## 2012-02-07 DIAGNOSIS — Z1151 Encounter for screening for human papillomavirus (HPV): Secondary | ICD-10-CM | POA: Insufficient documentation

## 2012-02-07 DIAGNOSIS — Z01419 Encounter for gynecological examination (general) (routine) without abnormal findings: Secondary | ICD-10-CM | POA: Insufficient documentation

## 2012-02-07 DIAGNOSIS — Z780 Asymptomatic menopausal state: Secondary | ICD-10-CM

## 2012-02-07 LAB — COMPREHENSIVE METABOLIC PANEL
BUN: 14 mg/dL (ref 6–23)
CO2: 28 mEq/L (ref 19–32)
Calcium: 9.5 mg/dL (ref 8.4–10.5)
Chloride: 107 mEq/L (ref 96–112)
Creat: 0.89 mg/dL (ref 0.50–1.10)
Glucose, Bld: 71 mg/dL (ref 70–99)
Total Bilirubin: 0.3 mg/dL (ref 0.3–1.2)

## 2012-02-07 LAB — LIPID PANEL
Cholesterol: 195 mg/dL (ref 0–200)
HDL: 73 mg/dL (ref >39)
Total CHOL/HDL Ratio: 2.7 Ratio
Triglycerides: 50 mg/dL (ref <150)
VLDL: 10 mg/dL (ref 0–40)

## 2012-02-07 LAB — TSH: TSH: 0.753 u[IU]/mL (ref 0.350–4.500)

## 2012-02-07 NOTE — Patient Instructions (Signed)
Follow up for lab results and bone density study

## 2012-02-07 NOTE — Progress Notes (Signed)
Cynthia Nunez 02-08-1951 [id number]        61 y.o.  G0P0 for annual exam.  Has not been in the office over 3 years. Several issues noted below.  Past medical history,surgical history, medications, allergies, family history and social history were all reviewed and documented in the EPIC chart. ROS:  Was performed and pertinent positives and negatives are included in the history.  Exam: Fleet Contras assistant Filed Vitals:   02/07/12 1058  BP: 122/82  Height: 5\' 6"  (1.676 m)  Weight: 166 lb (75.297 kg)   General appearance  Normal Skin grossly normal Head/Neck normal with no cervical or supraclavicular adenopathy thyroid normal Lungs  clear Cardiac RR, without RMG Abdominal  soft, nontender, without masses, organomegaly or hernia Breasts  examined lying and sitting without masses, retractions, discharge or axillary adenopathy. Pelvic  Ext/BUS/vagina  normal Pap/HPV of cuff  Adnexa  Without masses or tenderness    Anus and perineum  normal   Rectovaginal  Declined.    Assessment/Plan:  61 y.o. G0P0 female for annual exam has not been in the office for over 3 years. Without GYN complaints.   1. 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 Follow up CT scan was negative for disease. Consultation with Dr. Rockney Ghee, gynecologic oncologist recommended no specific follow up. She reported less than 100 cases of these in the world literature at that time and did not feel given that it was limited to the ovary, negative washings, negative CA 125 and a negative follow up CT scan that anything further needs to be done unless patient developed symptoms. Patient is doing well now and we'll continue to monitor. She is without significant menopausal symptoms off HRT. 2. Pap smear. Patient recently had an anal lesion excised by general surgery in June which turned out to have low-grade dysplasia,  margins clear. Pap/HPV of the vaginal cuff done.  Patient has a follow up appointment to see her surgeon with planned exam and she declined rectal exam today. She did have a colonoscopy without other lesions. 3. Mammography. Patient has scheduled today. SBE monthly reviewed. 4. DEXA. DEXA 2008 normal. Plan repeat now at a 5 year interval.  Increased calcium vitamin D reviewed.  Check vitamin D level today. 5. Thrombocytopenia. Patient has long history of thrombocytopenia with recent value 94 in June. Not repeated today.  Has seen hematologist in the past with recommendation just to follow, as long as remains at this level. 6. Health maintenance.  Baseline comprehensive metabolic panel lipid profile TSH urinalysis ordered. Follow up one year, sooner as needed.    Dara Lords MD, 11:32 AM 02/07/2012

## 2012-02-08 LAB — URINALYSIS W MICROSCOPIC + REFLEX CULTURE
Bacteria, UA: NONE SEEN
Casts: NONE SEEN
Glucose, UA: NEGATIVE mg/dL
Hgb urine dipstick: NEGATIVE
Ketones, ur: NEGATIVE mg/dL
Nitrite: NEGATIVE
Specific Gravity, Urine: 1.017 (ref 1.005–1.030)
pH: 6.5 (ref 5.0–8.0)

## 2012-02-10 LAB — URINE CULTURE: Colony Count: 85000

## 2012-02-11 ENCOUNTER — Encounter: Payer: Self-pay | Admitting: Gynecology

## 2012-02-13 ENCOUNTER — Telehealth: Payer: Self-pay | Admitting: Gynecology

## 2012-02-13 MED ORDER — NITROFURANTOIN MONOHYD MACRO 100 MG PO CAPS: 100.0000 mg | ORAL_CAPSULE | Freq: Two times a day (BID) | ORAL | Status: DC

## 2012-02-13 NOTE — Telephone Encounter (Signed)
Tell patient low level bacteria in urine.  I want to treat with macrobid 100 mg bid x 5 days

## 2012-02-14 ENCOUNTER — Other Ambulatory Visit: Payer: Self-pay | Admitting: *Deleted

## 2012-02-14 DIAGNOSIS — R928 Other abnormal and inconclusive findings on diagnostic imaging of breast: Secondary | ICD-10-CM

## 2012-02-14 NOTE — Telephone Encounter (Signed)
Left message for pt to call.

## 2012-02-16 ENCOUNTER — Ambulatory Visit (INDEPENDENT_AMBULATORY_CARE_PROVIDER_SITE_OTHER): Payer: Managed Care, Other (non HMO) | Admitting: Surgery

## 2012-02-16 ENCOUNTER — Encounter (INDEPENDENT_AMBULATORY_CARE_PROVIDER_SITE_OTHER): Payer: Self-pay | Admitting: Surgery

## 2012-02-16 VITALS — BP 122/84 | HR 77 | Temp 97.9°F | Ht 66.0 in | Wt 168.0 lb

## 2012-02-16 DIAGNOSIS — K6282 Dysplasia of anus: Secondary | ICD-10-CM

## 2012-02-16 NOTE — Progress Notes (Signed)
Subjective:     Patient ID: Cynthia Nunez, female   DOB: 16-Oct-1950, 61 y.o.   MRN: [id number]  HPI   Cynthia Nunez  04/01/1951 [id number]  Patient Care Team: Dorrene German, MD as PCP - General (Internal Medicine) Shirley Friar, MD as Consulting Physician (Gastroenterology) Dara Lords, MD as Consulting Physician (Obstetrics and Gynecology)  This patient is a 61 y.o.female who presents today for surgical evaluation.   Procedure: Removal of anal canal mass 10/22/2011  Pathology:   FINAL DIAGNOSIS Diagnosis Anus, biopsy - LOW GRADE SQUAMOUS INTRAEPITHELIAL LESION, AIN-I (MILD DYSPLASIA/ CONDYLOMA). - MARGINS ARE NOT INVOLVED. Cynthia Miyamoto MD Pathologist, Electronic Signature (Case signed 10/25/2011)  The patient comes in today feeling fine.  No rectal discomfort.  2-3 soft BMs a day.  In good spirits.  No fevers or chills.  No rectal drainage or pus.  No anal bleeding  Patient Active Problem List  Diagnosis  . Personal history of colonic polyps  . Anal intraepithelial neoplasia I (AIN I), anterior midline  . ITP (idiopathic thrombocytopenic purpura)  . Cancer    Past Medical History  Diagnosis Date  . Constipation 10-18-11    chronic- is much improved with diet changes  . Herniated disc 10-18-11    Lumbar disc problem-no problems at  present time.  . Allergic rhinitis 10-18-11    no longer a problem  . Bronchitis, chronic 10-18-11    no recent problems  . Skin cyst 10-18-11    right  thigh-posterior near bend of knee  . Condyloma   . Hyperlipidemia   . Cough 10-18-11    not at this time  . GERD (gastroesophageal reflux disease) 10-18-11    controls with diet-rare occ.  Marland Kitchen Blood dyscrasia 10-18-11    Idiopathic thrombocytopenia purpura-no recent problems  . ITP (idiopathic thrombocytopenic purpura)   . Cancer     Right ovary-Srumal carcinoma    Past Surgical History  Procedure Date  . Colonoscpy 2007  . Bunionectomy 10-18-11    both  foot-retained pins  . Hand surgery 2000 - approximate    left hand-fracture repair  . Colonoscopy 04/28/2011    Procedure: COLONOSCOPY;  Surgeon: Shirley Friar, MD;  Location: [facility];  Service: Endoscopy;  Laterality: N/A;  . Abdominal hysterectomy 10-18-11    uterus removed and one ovary removed  . Examination under anesthesia 10/22/2011    Procedure: EXAM UNDER ANESTHESIA;  Surgeon: Ardeth Sportsman, MD;  Location: [facility];  Service: General;  Laterality: N/A;  . Breast surgery     Biopsy  . Oophorectomy     RSO    History   Social History  . Marital Status: Married    Spouse Name: N/A    Number of Children: N/A  . Years of Education: N/A   Occupational History  . Not on file.   Social History Main Topics  . Smoking status: Never Smoker   . Smokeless tobacco: Never Used  . Alcohol Use: Yes     occ. to rare social  . Drug Use: No  . Sexually Active: Yes   Other Topics Concern  . Not on file   Social History Narrative  . No narrative on file    History reviewed. No pertinent family history.  Current Outpatient Prescriptions  Medication Sig Dispense Refill  . albuterol (PROVENTIL) (2.5 MG/3ML) 0.083% nebulizer solution Take 2.5 mg by nebulization every 6 (six) hours as needed.      . benzonatate (  TESSALON) 100 MG capsule Take 100 mg by mouth 3 (three) times daily as needed.      Marland Kitchen ibuprofen (ADVIL,MOTRIN) 200 MG tablet Take 400 mg by mouth every 6 (six) hours as needed. pain      . nitrofurantoin, macrocrystal-monohydrate, (MACROBID) 100 MG capsule Take 1 capsule (100 mg total) by mouth 2 (two) times daily. For 5 days  10 capsule  0     Allergies  Allergen Reactions  . Sudafed (Pseudoephedrine Hcl)     Passed out  . Miralax (Polyethylene Glycol)     constipation    BP 122/84  Pulse 77  Temp 97.9 F (36.6 C) (Temporal)  Ht 5\' 6"  (1.676 m)  Wt 168 lb (76.204 kg)  BMI 27.12 kg/m2  SpO2 96%  No results found.  Review of Systems    Constitutional: Negative for fever, chills and diaphoresis.  HENT: Negative for ear pain, sore throat and trouble swallowing.   Eyes: Negative for photophobia and visual disturbance.  Respiratory: Negative for cough and choking.   Cardiovascular: Negative for chest pain and palpitations.  Gastrointestinal: Positive for anal bleeding. Negative for nausea, vomiting, abdominal pain, diarrhea, constipation, blood in stool and rectal pain.  Genitourinary: Negative for dysuria, frequency and difficulty urinating.  Musculoskeletal: Negative for myalgias and gait problem.  Skin: Negative for color change, pallor and rash.  Neurological: Negative for dizziness, speech difficulty, weakness and numbness.  Hematological: Negative for adenopathy.  Psychiatric/Behavioral: Negative for confusion and agitation. The patient is not nervous/anxious.        Objective:   Physical Exam  Constitutional: She is oriented to person, place, and time. She appears well-developed and well-nourished. No distress.  HENT:  Head: Normocephalic.  Mouth/Throat: Oropharynx is clear and moist. No oropharyngeal exudate.  Eyes: Conjunctivae normal and EOM are normal. Pupils are equal, round, and reactive to light. No scleral icterus.  Neck: Normal range of motion. No tracheal deviation present.  Cardiovascular: Normal rate and intact distal pulses.   Pulmonary/Chest: Effort normal. No respiratory distress. She exhibits no tenderness.  Abdominal: Soft. She exhibits no distension. There is no tenderness. Hernia confirmed negative in the right inguinal area and confirmed negative in the left inguinal area.       Incisions clean with normal healing ridges.  No hernias  Genitourinary: No vaginal discharge found.       Exam done with assistance of female Medical Assistant in the room.  Perianal skin clean with good hygiene.  No pruritis.  No external skin tags / hemorrhoids of significance.  No pilonidal disease.  No fissure.  No  abscess/fistula.    Normal sphincter tone.    Musculoskeletal: Normal range of motion. She exhibits no tenderness.  Lymphadenopathy:       Right: No inguinal adenopathy present.       Left: No inguinal adenopathy present.  Neurological: She is alert and oriented to person, place, and time. No cranial nerve deficit. She exhibits normal muscle tone. Coordination normal.  Skin: Skin is warm and dry. No rash noted. She is not diaphoretic.  Psychiatric: She has a normal mood and affect. Her behavior is normal.       Assessment:     S/p excision of anal AIN1 solitary mass in immunocompetent female, no evidence of disease    Plan:     Increase activity as tolerated.  Do not push through pain.  Continue high fiber diet to avoid problems.  RTC 6 months for final check.  I  explained the pathophysiology of anal intraepithelial neoplasia.  The importance of barrier birth control.  Noted the importance of following up to catch & control any recurrence early.  Questions were answered. The patient expressed understanding and appreciation

## 2012-02-16 NOTE — Patient Instructions (Signed)
Genital Warts Genital warts are a sexually transmitted infection. They may appear as small bumps on the tissues of the genital area. CAUSES  Genital warts are caused by a virus called human papillomavirus (HPV). HPV is the most common sexually transmitted disease (STD) and infection of the sex organs. This infection is spread by having unprotected sex with an infected person. It can be spread by vaginal, anal, and oral sex. Many people do not know they are infected. They may be infected for years without problems. However, even if they do not have problems, they can unknowingly pass the infection to their sexual partners. SYMPTOMS   Itching and irritation in the genital area.  Warts that bleed.  Painful sexual intercourse. DIAGNOSIS  Warts are usually recognized with the naked eye on the vagina, vulva, perineum, anus, and rectum. Certain tests can also diagnose genital warts, such as:  A Pap test.  A tissue sample (biopsy) exam.  Colposcopy. A magnifying tool is used to examine the vagina and cervix. The HPV cells will change color when certain solutions are used. TREATMENT  Warts can be removed by:  Applying certain chemicals, such as cantharidin or podophyllin.  Liquid nitrogen freezing (cryotherapy).  Immunotherapy with candida or trichophyton injections.  Laser treatment.  Burning with an electrified probe (electrocautery).  Interferon injections.  Surgery. PREVENTION  HPV vaccination can help prevent HPV infections that cause genital warts and that cause cancer of the cervix. It is recommended that the vaccination be given to people between the ages 9 to 26 years old. The vaccine might not work as well or might not work at all if you already have HPV. It should not be given to pregnant women. HOME CARE INSTRUCTIONS   It is important to follow your caregiver's instructions. The warts will not go away without treatment. Repeat treatments are often needed to get rid of warts.  Even after it appears that the warts are gone, the normal tissue underneath often remains infected.  Do not try to treat genital warts with medicine used to treat hand warts. This type of medicine is strong and can burn the skin in the genital area, causing more damage.  Tell your past and current sexual partner(s) that you have genital warts. They may be infected also and need treatment.  Avoid sexual contact while being treated.  Do not touch or scratch the warts. The infection may spread to other parts of your body.  Women with genital warts should have a cervical cancer check (Pap test) at least once a year. This type of cancer is slow-growing and can be cured if found early. Chances of developing cervical cancer are increased with HPV.  Inform your obstetrician about your warts in the event of pregnancy. This virus can be passed to the baby's respiratory tract. Discuss this with your caregiver.  Use a condom during sexual intercourse. Following treatment, the use of condoms will help prevent reinfection.  Ask your caregiver about using over-the-counter anti-itch creams. SEEK MEDICAL CARE IF:   Your treated skin becomes red, swollen, or painful.  You have a fever.  You feel generally ill.  You feel little lumps in and around your genital area.  You are bleeding or have painful sexual intercourse. MAKE SURE YOU:   Understand these instructions.  Will watch your condition.  Will get help right away if you are not doing well or get worse. Document Released: 04/23/2000 Document Revised: 07/19/2011 Document Reviewed: 11/02/2010 [employer] Patient Information 2013 [employer].    GETTING TO GOOD BOWEL HEALTH. Irregular bowel habits such as constipation and diarrhea can lead to many problems over time.  Having one soft bowel movement a day is the most important way to prevent further problems.  The anorectal canal is designed to handle stretching and feces to safely manage our  ability to get rid of solid waste (feces, poop, stool) out of our body.  BUT, hard constipated stools can act like ripping concrete bricks and diarrhea can be a burning fire to this very sensitive area of our body, causing inflamed hemorrhoids, anal fissures, increasing risk is perirectal abscesses, abdominal pain/bloating, an making irritable bowel worse.     The goal: ONE SOFT BOWEL MOVEMENT A DAY!  To have soft, regular bowel movements:    Drink at least 8 tall glasses of water a day.     Take plenty of fiber.  Fiber is the undigested part of plant food that passes into the colon, acting s "natures broom" to encourage bowel motility and movement.  Fiber can absorb and hold large amounts of water. This results in a larger, bulkier stool, which is soft and easier to pass. Work gradually over several weeks up to 6 servings a day of fiber (25g a day even more if needed) in the form of: o Vegetables -- Root (potatoes, carrots, turnips), leafy green (lettuce, salad greens, celery, spinach), or cooked high residue (cabbage, broccoli, etc) o Fruit -- Fresh (unpeeled skin & pulp), Dried (prunes, apricots, cherries, etc ),  or stewed ( applesauce)  o Whole grain breads, pasta, etc (whole wheat)  o Bran cereals    Bulking Agents -- This type of water-retaining fiber generally is easily obtained each day by one of the following:  o Psyllium bran -- The psyllium plant is remarkable because its ground seeds can retain so much water. This product is available as Metamucil, Konsyl, Effersyllium, Per Diem Fiber, or the less expensive generic preparation in drug and health food stores. Although labeled a laxative, it really is not a laxative.  o Methylcellulose -- This is another fiber derived from wood which also retains water. It is available as Citrucel. o Polyethylene Glycol - and "artificial" fiber commonly called Miralax or Glycolax.  It is helpful for people with gassy or bloated feelings with regular  fiber o Flax Seed - a less gassy fiber than psyllium   No reading or other relaxing activity while on the toilet. If bowel movements take longer than 5 minutes, you are too constipated   AVOID CONSTIPATION.  High fiber and water intake usually takes care of this.  Sometimes a laxative is needed to stimulate more frequent bowel movements, but    Laxatives are not a good long-term solution as it can wear the colon out. o Osmotics (Milk of Magnesia, Fleets phosphosoda, Magnesium citrate, MiraLax, GoLytely) are safer than  o Stimulants (Senokot, Castor Oil, Dulcolax, Ex Lax)    o Do not take laxatives for more than 7days in a row.    IF SEVERELY CONSTIPATED, try a Bowel Retraining Program: o Do not use laxatives.  o Eat a diet high in roughage, such as bran cereals and leafy vegetables.  o Drink six (6) ounces of prune or apricot juice each morning.  o Eat two (2) large servings of stewed fruit each day.  o Take one (1) heaping tablespoon of a psyllium-based bulking agent twice a day. Use sugar-free sweetener when possible to avoid excessive calories.  o Eat a normal breakfast.    o Set aside 15 minutes after breakfast to sit on the toilet, but do not strain to have a bowel movement.  o If you do not have a bowel movement by the third day, use an enema and repeat the above steps.    Controlling diarrhea o Switch to liquids and simpler foods for a few days to avoid stressing your intestines further. o Avoid dairy products (especially milk & ice cream) for a short time.  The intestines often can lose the ability to digest lactose when stressed. o Avoid foods that cause gassiness or bloating.  Typical foods include beans and other legumes, cabbage, broccoli, and dairy foods.  Every person has some sensitivity to other foods, so listen to our body and avoid those foods that trigger problems for you. o Adding fiber (Citrucel, Metamucil, psyllium, Miralax) gradually can help thicken stools by absorbing  excess fluid and retrain the intestines to act more normally.  Slowly increase the dose over a few weeks.  Too much fiber too soon can backfire and cause cramping & bloating. o Probiotics (such as active yogurt, Align, etc) may help repopulate the intestines and colon with normal bacteria and calm down a sensitive digestive tract.  Most studies show it to be of mild help, though, and such products can be costly. o Medicines:   Bismuth subsalicylate (ex. Kayopectate, Pepto Bismol) every 30 minutes for up to 6 doses can help control diarrhea.  Avoid if pregnant.   Loperamide (Immodium) can slow down diarrhea.  Start with two tablets (4mg total) first and then try one tablet every 6 hours.  Avoid if you are having fevers or severe pain.  If you are not better or start feeling worse, stop all medicines and call your doctor for advice o Call your doctor if you are getting worse or not better.  Sometimes further testing (cultures, endoscopy, X-ray studies, bloodwork, etc) may be needed to help diagnose and treat the cause of the diarrhea. o   

## 2012-02-17 ENCOUNTER — Other Ambulatory Visit: Payer: Self-pay | Admitting: Gynecology

## 2012-02-17 DIAGNOSIS — R928 Other abnormal and inconclusive findings on diagnostic imaging of breast: Secondary | ICD-10-CM

## 2012-02-17 NOTE — Telephone Encounter (Signed)
Left message on pt voicemail, Rx at pharmacy for pick up. LM to call back and let me know it has been picked up.

## 2012-02-18 NOTE — Telephone Encounter (Signed)
Pt called back and said to leave message on vm. Left message on vm UTI and to take medication.

## 2012-07-18 ENCOUNTER — Encounter (INDEPENDENT_AMBULATORY_CARE_PROVIDER_SITE_OTHER): Payer: Self-pay | Admitting: Surgery

## 2012-10-03 ENCOUNTER — Ambulatory Visit (INDEPENDENT_AMBULATORY_CARE_PROVIDER_SITE_OTHER): Payer: Managed Care, Other (non HMO) | Admitting: Gynecology

## 2012-10-03 ENCOUNTER — Encounter: Payer: Self-pay | Admitting: Gynecology

## 2012-10-03 DIAGNOSIS — N899 Noninflammatory disorder of vagina, unspecified: Secondary | ICD-10-CM

## 2012-10-03 DIAGNOSIS — N390 Urinary tract infection, site not specified: Secondary | ICD-10-CM

## 2012-10-03 DIAGNOSIS — R300 Dysuria: Secondary | ICD-10-CM

## 2012-10-03 DIAGNOSIS — N898 Other specified noninflammatory disorders of vagina: Secondary | ICD-10-CM

## 2012-10-03 LAB — URINALYSIS W MICROSCOPIC + REFLEX CULTURE
Ketones, ur: NEGATIVE mg/dL
Nitrite: NEGATIVE
Specific Gravity, Urine: 1.01 (ref 1.005–1.030)
Urobilinogen, UA: 0.2 mg/dL (ref 0.0–1.0)

## 2012-10-03 LAB — WET PREP FOR TRICH, YEAST, CLUE
Clue Cells Wet Prep HPF POC: NONE SEEN
Yeast Wet Prep HPF POC: NONE SEEN

## 2012-10-03 MED ORDER — FLUCONAZOLE 150 MG PO TABS: 150.0000 mg | ORAL_TABLET | Freq: Once | ORAL | Status: DC

## 2012-10-03 MED ORDER — NITROFURANTOIN MONOHYD MACRO 100 MG PO CAPS: 100.0000 mg | ORAL_CAPSULE | Freq: Two times a day (BID) | ORAL | Status: DC

## 2012-10-03 NOTE — Progress Notes (Signed)
Patient presents with several week history of vulvar itching and irritation. Also with dysuria/frequency and some suprapubic discomfort. No fever chills nausea vomiting or low back pain.  Exam was Radiographer, therapeutic Spine straight without CVA tenderness Abdomen soft nontender without masses guarding rebound organomegaly. Pelvic external BUS vagina with moderate atrophic changes. White discharge noted. Bimanual without masses or tenderness.  Assessment and plan: 1. UTI. Urinalysis and symptoms consistent with UTI. Will treat with Macrobid 100 mg twice a day x7 days. Followup if symptoms persist, worsen or recur. 2. Vulvar irritation with discharge. Wet prep's unremarkable. Suspicious for yeast by history. Will cover with Diflucan 150 mg x1 dose. Followup if symptoms persist worsen or recur.

## 2012-10-03 NOTE — Patient Instructions (Addendum)
Take Macrobid antibiotic twice daily for 7 days. Take Diflucan pill once. Followup if symptoms persist, worsen or recur.

## 2012-10-04 ENCOUNTER — Ambulatory Visit (INDEPENDENT_AMBULATORY_CARE_PROVIDER_SITE_OTHER): Payer: Managed Care, Other (non HMO) | Admitting: Surgery

## 2012-10-04 ENCOUNTER — Encounter (INDEPENDENT_AMBULATORY_CARE_PROVIDER_SITE_OTHER): Payer: Self-pay | Admitting: Surgery

## 2012-10-04 VITALS — BP 138/80 | HR 60 | Temp 97.2°F | Resp 14 | Ht 66.0 in | Wt 162.4 lb

## 2012-10-04 DIAGNOSIS — K6282 Dysplasia of anus: Secondary | ICD-10-CM

## 2012-10-04 NOTE — Patient Instructions (Signed)
Followup as needed  Anal Intraepithelial Neoplasia (AIN) ANAL INTRAEPITHELIAL NEOPLASIA (AIN) Anal Intra-epithelial Neoplasia (AIN) is a pre-cancerous condition of the skin surrounding the anus. In the early stages of AIN there are abnormal skin cells (epithelial cells) in the outer third of the skin (epithelium). This is called AIN1 (also called a low-grade anal squamous intra-epithelial lesion (LSIL). This can progress to AIN2, where the outer 2/3 of the skin contains abnormal cells, which in turn can result in AIN3 where there are abnormal cells involving the full thickness of skin (AIN2 and AIN3 are also called high-grade anal squamous intra-epithelial lesions (HSIL). The next step is for these abnormal cells to cross a barrier at the base of the skin called the basement membrane. Once this occurs, it is invasive cancer (carcinoma).  RISK FACTORS The risk factors for AIN are the same as for anal cancer, and include the immunosuppressed (eg HIV and transplant patients), and those with previous anal warts. PREVENTION The vaccine currently offered to women to reduce the risk of cervical cancer targets the Human Papilloma Virus (HPV) serotypes 16, 18, 6 and 11. Although not approved for this purpose, this vaccine may have a role for reducing the risk of AIN and anal cancer in high risk populations who do not yet have HPV infection. TREATMENT OF AIN Treatment is aimed at eradicating AIN and preventing anal cancer with minimal disturbance to anal function. There is currently no uniform treatment largely due to the uncertain natural history of AIN, the variable extent of disease, and the fact there is not a universally effective treatment modality.  TOPICAL AGENTS Imiquimod cream 5% (Aldara ) can be applied to the area 3 times a day, and can be used for up to 16 weeks. It is an immunomodulatory agent and that attacks the virus responsible for warts, but also has anti-tumour effects [1]. There is some  evidence that it not only slows the progression of AIN, but causes regression, with one study showing that 3/4 of men with AIN had their AIN completely cleared at the end of treatment[2]. It also has the added benefit in those with warts of causing regression of these, and in some cases eradication of the HPV virus. The main disadvantage is relapse in a quarter of cases after cessation of therapy[1]. Side effects of imiquimod include erythema, "flu?like" illness and erosions, which are usually mild. It should not be applied prior to sexual intercourse. A systematic review showed a noncompliance rate of less than 5% because of side effects, mainly related to incompatibility with sexual life [3-4]. Topical 5-Fluorouracil 5% cream (Efudix) can be applied to the area 1-2 times a day, and the largest study showing a recurrence of high grade AIN in only 8% of patients when used for 9-12 weeks[5]. PHOTODYNAMIC THERAPY Photodynamic therapy (PDT) involves the application of a cream (topical sensitizer) such as 5-Flourouracil (Efudix) and subsequent exposure of the anal region to light and oxygen to generate oxygen intermediates that damage areas with AIN [6]. Although there is little evidence, there is a suggestion that early AIN responds to this treatment [7-8]. INFRARED PHOTOCOAGULATION Infrared photocoagulation is the same as photodynamic therapy except that it only uses light with a wavelength longer than visible light. It's use for AIN was first described by Gwenlyn Fudge and colleagues [9], who showed that up to 2/3 of AIN can be cured and is effective in preventing progression to cancer.  SURGERY The treatment of widespread AIN3 is controversial. Most advocate surgery, however even with  surgery there is a one third risk of recurrence [10-11]. If surgery is performed, it consists of either local excision or extensive excision with split skin grafting. The high recurrence rate with these procedures is thought to  be related to ongoing HPV, multi?focal lesions that are not all treated, and a generalised field change. [place] of the high recurrence rate and extensive surgery required for widespread AIN3, many advocate just close surveillance with regular (3-6 monthly) biopsies, so that early invasive anal cancer can be picked up early and treated accordingly. RADIOTHERAPY Radiotherapy has no role for AIN 3. Its only role is for confirmed anal cancer. FOLLOW-UP Follow?up should involve anoscopic examination, with or without the aid of high?resolution anoscopy. AIN 1 should be reviewed every 6?12 months with discharge from follow up upon resolution of AIN. AIN 3, especially in HIV?positive patients should be followed more closely every 3?6 months with or without treatment. The follow?up of AIN 2 is less clear, as the natural history of this condition is so uncertain. A regime somewhere between that of AIN 1 and 3 is advised, with HIV?positive or immunosuppressed patients being followed more like patients with AIN 3.

## 2012-10-04 NOTE — Progress Notes (Signed)
Subjective:     Patient ID: Cynthia Nunez, female   DOB: [birth year], 62 y.o.   MRN: [id number]  HPI   Cynthia Nunez  04/29/1951 [id number]  Patient Care Team: Dorrene German, MD as PCP - General (Internal Medicine) Shirley Friar, MD as Consulting Physician (Gastroenterology) Dara Lords, MD as Consulting Physician (Obstetrics and Gynecology)  This patient is a 62 y.o.female who presents today for surgical evaluation.   Procedure: Removal of anal canal mass 10/22/2011  Pathology:   FINAL DIAGNOSIS Diagnosis Anus, biopsy - LOW GRADE SQUAMOUS INTRAEPITHELIAL LESION, AIN-I (MILD DYSPLASIA/ CONDYLOMA). - MARGINS ARE NOT INVOLVED. Cynthia Miyamoto MD Pathologist, Electronic Signature (Case signed 10/25/2011)  The patient comes in today feeling well.  No rectal discomfort.  1 soft BM a day.  In good spirits.  No fevers or chills.  No rectal drainage or pus.  No anal bleeding.  No itching or discomfort.  She feels no mass.  Patient Active Problem List   Diagnosis Date Noted  . ITP (idiopathic thrombocytopenic purpura)   . Cancer   . Anal intraepithelial neoplasia I (AIN I), anterior midline, s/p excision [id number] 08/27/2011  . Personal history of colonic polyps 04/28/2011    Past Medical History  Diagnosis Date  . Constipation 10-18-11    chronic- is much improved with diet changes  . Herniated disc 10-18-11    Lumbar disc problem-no problems at  present time.  . Allergic rhinitis 10-18-11    no longer a problem  . Bronchitis, chronic 10-18-11    no recent problems  . Skin cyst 10-18-11    right  thigh-posterior near bend of knee  . Condyloma   . Hyperlipidemia   . Cough 10-18-11    not at this time  . GERD (gastroesophageal reflux disease) 10-18-11    controls with diet-rare occ.  Marland Kitchen Blood dyscrasia 10-18-11    Idiopathic thrombocytopenia purpura-no recent problems  . ITP (idiopathic thrombocytopenic purpura)   . Cancer     Right ovary-Srumal carcinoma  . Anal  intraepithelial neoplasia I (AIN I), anterior midline, s/p excision [id number] 08/27/2011    Past Surgical History  Procedure Laterality Date  . Colonoscpy  2007  . Bunionectomy  10-18-11    both foot-retained pins  . Hand surgery  2000 - approximate    left hand-fracture repair  . Colonoscopy  04/28/2011    Procedure: COLONOSCOPY;  Surgeon: Shirley Friar, MD;  Location: [facility];  Service: Endoscopy;  Laterality: N/A;  . Abdominal hysterectomy  10-18-11    uterus removed and one ovary removed  . Examination under anesthesia  10/22/2011    Procedure: EXAM UNDER ANESTHESIA;  Surgeon: Ardeth Sportsman, MD;  Location: [facility];  Service: General;  Laterality: N/A;  . Breast surgery      Biopsy  . Oophorectomy      RSO    History   Social History  . Marital Status: Married    Spouse Name: N/A    Number of Children: N/A  . Years of Education: N/A   Occupational History  . Not on file.   Social History Main Topics  . Smoking status: Never Smoker   . Smokeless tobacco: Never Used  . Alcohol Use: Yes     Comment: occ. to rare social  . Drug Use: No  . Sexually Active: Yes   Other Topics Concern  . Not on file   Social History Narrative  . No narrative on file  History reviewed. No pertinent family history.  Current Outpatient Prescriptions  Medication Sig Dispense Refill  . albuterol (PROVENTIL) (2.5 MG/3ML) 0.083% nebulizer solution Take 2.5 mg by nebulization every 6 (six) hours as needed.      . benzonatate (TESSALON) 100 MG capsule Take 100 mg by mouth 3 (three) times daily as needed.      . fluconazole (DIFLUCAN) 150 MG tablet Take 1 tablet (150 mg total) by mouth once.  1 tablet  0  . ibuprofen (ADVIL,MOTRIN) 200 MG tablet Take 400 mg by mouth every 6 (six) hours as needed. pain      . nitrofurantoin, macrocrystal-monohydrate, (MACROBID) 100 MG capsule Take 1 capsule (100 mg total) by mouth 2 (two) times daily. For 7 days  14 capsule  0   No current  facility-administered medications for this visit.     Allergies  Allergen Reactions  . Sudafed (Pseudoephedrine Hcl)     Passed out  . Miralax (Polyethylene Glycol)     constipation    BP 138/80  Pulse 60  Temp(Src) 97.2 F (36.2 C) (Temporal)  Resp 14  Ht 5\' 6"  (1.676 m)  Wt 162 lb 6.4 oz (73.664 kg)  BMI 26.22 kg/m2  No results found.  Review of Systems  Constitutional: Negative for fever, chills and diaphoresis.  HENT: Negative for ear pain, sore throat and trouble swallowing.   Eyes: Negative for photophobia and visual disturbance.  Respiratory: Negative for cough and choking.   Cardiovascular: Negative for chest pain and palpitations.  Gastrointestinal: Positive for anal bleeding. Negative for nausea, vomiting, abdominal pain, diarrhea, constipation, blood in stool and rectal pain.  Genitourinary: Negative for dysuria, frequency and difficulty urinating.  Musculoskeletal: Negative for myalgias and gait problem.  Skin: Negative for color change, pallor and rash.  Neurological: Negative for dizziness, speech difficulty, weakness and numbness.  Hematological: Negative for adenopathy.  Psychiatric/Behavioral: Negative for confusion and agitation. The patient is not nervous/anxious.        Objective:   Physical Exam  Constitutional: She is oriented to person, place, and time. She appears well-developed and well-nourished. No distress.  HENT:  Head: Normocephalic.  Mouth/Throat: Oropharynx is clear and moist. No oropharyngeal exudate.  Eyes: Conjunctivae and EOM are normal. Pupils are equal, round, and reactive to light. No scleral icterus.  Neck: Normal range of motion. No tracheal deviation present.  Cardiovascular: Normal rate and intact distal pulses.   Pulmonary/Chest: Effort normal. No respiratory distress. She exhibits no tenderness.  Abdominal: Soft. She exhibits no distension. There is no tenderness. Hernia confirmed negative in the right inguinal area and  confirmed negative in the left inguinal area.  Incisions clean with normal healing ridges.  No hernias  Genitourinary: No vaginal discharge found.  Exam done with assistance of female Medical Assistant in the room.  Perianal skin clean with good hygiene.  No pruritis.  No external skin tags / hemorrhoids of significance.  No pilonidal disease.  No fissure.  No abscess/fistula.    Normal sphincter tone.    Musculoskeletal: Normal range of motion. She exhibits no tenderness.  Lymphadenopathy:       Right: No inguinal adenopathy present.       Left: No inguinal adenopathy present.  Neurological: She is alert and oriented to person, place, and time. No cranial nerve deficit. She exhibits normal muscle tone. Coordination normal.  Skin: Skin is warm and dry. No rash noted. She is not diaphoretic.  Psychiatric: She has a normal mood and affect. Her behavior  is normal.       Assessment:     S/p excision of anal AIN1 solitary mass in immunocompetent female, no evidence of disease/recurrence >1 year    Plan:     Increase activity as tolerated to regular activity.  Low impact exercise such as walking an hour a day at least ideal.  Do not push through pain.  Diet as tolerated.  Low fat high fiber diet ideal.  Bowel regimen with 30 g fiber a day and fiber supplement as needed to avoid problems.  And she seems low risk and there was a solitary lesion, return to clinic as needed.   If she has any bleeding, discomfort, mass, or other concerns; then repeat reexamination.  Followup colonoscopy in three years.  Instructions discussed.  Followup with primary care physician for other health issues as would normally be done.  Questions answered.  The patient expressed understanding and appreciation

## 2013-02-08 ENCOUNTER — Encounter: Payer: Self-pay | Admitting: Gynecology

## 2013-02-13 ENCOUNTER — Encounter: Payer: Managed Care, Other (non HMO) | Admitting: Gynecology

## 2013-02-15 ENCOUNTER — Ambulatory Visit (INDEPENDENT_AMBULATORY_CARE_PROVIDER_SITE_OTHER): Payer: Managed Care, Other (non HMO) | Admitting: Gynecology

## 2013-02-15 ENCOUNTER — Other Ambulatory Visit (HOSPITAL_COMMUNITY)
Admission: RE | Admit: 2013-02-15 | Discharge: 2013-02-15 | Disposition: A | Discharge status: Home / Self Care | Payer: Managed Care, Other (non HMO) | Source: Ambulatory Visit | Attending: Gynecology | Admitting: Gynecology

## 2013-02-15 ENCOUNTER — Encounter: Payer: Self-pay | Admitting: Gynecology

## 2013-02-15 VITALS — BP 136/86 | Ht 66.0 in | Wt 163.0 lb

## 2013-02-15 DIAGNOSIS — Z1151 Encounter for screening for human papillomavirus (HPV): Secondary | ICD-10-CM | POA: Insufficient documentation

## 2013-02-15 DIAGNOSIS — Z13220 Encounter for screening for lipoid disorders: Secondary | ICD-10-CM

## 2013-02-15 DIAGNOSIS — Z780 Asymptomatic menopausal state: Secondary | ICD-10-CM

## 2013-02-15 DIAGNOSIS — Z01419 Encounter for gynecological examination (general) (routine) without abnormal findings: Secondary | ICD-10-CM

## 2013-02-15 DIAGNOSIS — D693 Immune thrombocytopenic purpura: Secondary | ICD-10-CM

## 2013-02-15 LAB — CBC WITH DIFFERENTIAL/PLATELET
Basophils Relative: 1 % (ref 0–1)
Eosinophils Absolute: 0.1 10*3/uL (ref 0.0–0.7)
HCT: 41.7 % (ref 36.0–46.0)
Hemoglobin: 13.5 g/dL (ref 12.0–15.0)
MCH: 25.8 pg — ABNORMAL LOW (ref 26.0–34.0)
MCHC: 32.4 g/dL (ref 30.0–36.0)
MCV: 79.6 fL (ref 78.0–100.0)
Monocytes Absolute: 0.4 10*3/uL (ref 0.1–1.0)
Monocytes Relative: 6 % (ref 3–12)

## 2013-02-15 NOTE — Patient Instructions (Signed)
followup for bone density as scheduled. Otherwise followup in one year, sooner as needed.

## 2013-02-15 NOTE — Progress Notes (Signed)
Cynthia Nunez 11-May-1950 [id number]        62 y.o.  G0P0 for annual exam.  Several issues noted below  Past medical history,surgical history, medications, allergies, family history and social history were all reviewed and documented in the EPIC chart.  ROS:  Performed and pertinent positives and negatives are included in the history, assessment and plan .  Exam: Kim assistant Filed Vitals:   02/15/13 1029  BP: 136/86  Height: 5\' 6"  (1.676 m)  Weight: 163 lb (73.936 kg)   General appearance  Normal Skin grossly normal Head/Neck normal with no cervical or supraclavicular adenopathy thyroid normal Lungs  clear Cardiac RR, without RMG Abdominal  soft, nontender, without masses, organomegaly or hernia Breasts  examined lying and sitting without masses, retractions, discharge or axillary adenopathy. Pelvic  Ext/BUS/vagina  Normal with mild atrophic changes  Adnexa  Without masses or tenderness    Anus and perineum  normal   Rectovaginal  normal sphincter tone without palpated masses or tenderness.    Assessment/Plan:  62 y.o. G0P0 female for annual exam.   1. 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 Follow up CT scan was negative for disease. Consultation with Dr. Rockney Ghee, gynecologic oncologist recommended no specific follow up. She reported less than 100 cases of these in the world literature at that time and did not feel given that it was limited to the ovary, negative washings, negative CA 125 and a negative follow up CT scan that anything further needs to be done unless patient developed symptoms. Patient is doing well now and we'll continue to monitor. She is without significant menopausal symptoms off HRT. 2. Pap smear/HPV 2013 negative. No history of abnormal Pap smears previously but had an anal lesion excised by general surgery in June 2013 which turned out to have  low-grade dysplasia, margins clear. Pap/HPV of the vaginal cuff done again today.   3. Mammography 02/2013. Continued annual mammography. SBE monthly reviewed. 4. DEXA. DEXA 2008 normal. Failed to do last year as ordered.  Plan repeat now at a 6 year interval.  Increased calcium vitamin D reviewed.  Check vitamin D level today. 5. Colonoscopy 2012. Repeat at their recommended interval. 6. Thrombocytopenia. Patient has long history of thrombocytopenia.  Has seen hematologist in the past with recommendation just to follow, as long as remains at an acceptable level. 7. Health maintenance.  Baseline CBC,comprehensive metabolic panel, lipid profile, TSH, vitamin D urinalysis ordered. Follow up one year, sooner as needed.   Note: This document was prepared with digital dictation and possible smart phrase technology. Any transcriptional errors that result from this process are unintentional.   Dara Lords MD, 11:24 AM 02/15/2013

## 2013-02-16 LAB — LIPID PANEL
HDL: 63 mg/dL (ref >39)
LDL Cholesterol: 122 mg/dL — ABNORMAL HIGH (ref 0–99)
Triglycerides: 58 mg/dL (ref <150)

## 2013-02-16 LAB — URINALYSIS W MICROSCOPIC + REFLEX CULTURE
Bilirubin Urine: NEGATIVE
Casts: NONE SEEN
Hgb urine dipstick: NEGATIVE
Ketones, ur: NEGATIVE mg/dL
Nitrite: NEGATIVE
pH: 7.5 (ref 5.0–8.0)

## 2013-02-16 LAB — COMPREHENSIVE METABOLIC PANEL
Albumin: 4.2 g/dL (ref 3.5–5.2)
Alkaline Phosphatase: 105 U/L (ref 39–117)
BUN: 10 mg/dL (ref 6–23)
Glucose, Bld: 101 mg/dL — ABNORMAL HIGH (ref 70–99)
Potassium: 5.3 mEq/L (ref 3.5–5.3)
Total Bilirubin: 0.5 mg/dL (ref 0.3–1.2)

## 2013-02-17 LAB — URINE CULTURE

## 2013-02-26 ENCOUNTER — Ambulatory Visit: Payer: Managed Care, Other (non HMO) | Admitting: Family Medicine

## 2013-02-26 VITALS — BP 126/78 | HR 65 | Temp 98.7°F | Resp 16 | Ht 66.0 in | Wt 163.0 lb

## 2013-02-26 DIAGNOSIS — J329 Chronic sinusitis, unspecified: Secondary | ICD-10-CM

## 2013-02-26 MED ORDER — AMOXICILLIN 875 MG PO TABS: 875.0000 mg | ORAL_TABLET | Freq: Two times a day (BID) | ORAL | Status: DC

## 2013-02-26 NOTE — Progress Notes (Signed)
Subjective:  This chart was scribed for Cynthia Sidle, MD by Quintella Reichert, ED scribe.  This patient was seen in room [facility] Room 9 and the patient's care was started at 7:14 PM.   Patient ID: Cynthia Nunez, female    DOB: 1950-05-19, 62 y.o.   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  She presents with a chief complaint of 3 days of progressively-worsening persistent head congestion.  Pt states that 3 days ago she had a "scratchy throat" during the day and that night she felt "wheezy."  The following morning she awoke with "my head feeling bad."  She has taken Allegra D and multiple other OTC medications but her congestion has continued to worsen.  She denies fever.  She denies h/o asthma.  She does not smoke.   Past Medical History  Diagnosis Date   Constipation 10-18-11    chronic- is much improved with diet changes   Herniated disc 10-18-11    Lumbar disc problem-no problems at  present time.   Allergic rhinitis 10-18-11    no longer a problem   Bronchitis, chronic 10-18-11    no recent problems   Skin cyst 10-18-11    right  thigh-posterior near bend of knee   Condyloma    Hyperlipidemia    Cough 10-18-11    not at this time   GERD (gastroesophageal reflux disease) 10-18-11    controls with diet-rare occ.   Blood dyscrasia 10-18-11    Idiopathic thrombocytopenia purpura-no recent problems   ITP (idiopathic thrombocytopenic purpura)    Cancer     Right ovary-Srumal carcinoma   Anal intraepithelial neoplasia I (AIN I), anterior midline, s/p excision Jun2013 08/27/2011      Medication List       This list is accurate as of: 02/26/13  7:13 PM.  Always use your most recent med list.               albuterol (2.5 MG/3ML) 0.083% nebulizer solution  Commonly known as:  PROVENTIL  Take 2.5 mg by nebulization every 6 (six) hours as needed.     ibuprofen 200 MG tablet  Commonly known as:  ADVIL,MOTRIN  Take  400 mg by mouth every 6 (six) hours as needed. pain        Allergies  Allergen Reactions   Sudafed [Pseudoephedrine Hcl]     Passed out   Miralax [Polyethylene Glycol]     constipation     Review of Systems  HENT: Positive for congestion.        Objective:   Physical Exam  Nursing note and vitals reviewed. Constitutional: No distress.  HENT:  Right Ear: Tympanic membrane and ear canal normal.  Left Ear: Tympanic membrane and ear canal normal.  Nose: Mucosal edema present.  Mouth/Throat: Oropharynx is clear and moist. No oropharyngeal exudate, posterior oropharyngeal edema or posterior oropharyngeal erythema.  Mucopurulent discharge from the nose  Neck: Neck supple.  Cardiovascular: Normal rate.   Pulmonary/Chest: Effort normal and breath sounds normal. No respiratory distress. She has no wheezes. She has no rales.  Lymphadenopathy:    She has no cervical adenopathy.  Skin: Skin is warm and dry.     BP 126/78   Pulse 65   Temp(Src) 98.7 F (37.1 C) (Oral)   Resp 16   Ht 5\' 6"  (1.676 m)   Wt 163 lb (73.936 kg)   BMI 26.32 kg/m2   SpO2  99%       Assessment & Plan:   Sinusitis - Plan: amoxicillin (AMOXIL) 875 MG tablet  Signed, Cynthia Sidle, MD

## 2013-02-26 NOTE — Patient Instructions (Signed)

## 2013-05-31 ENCOUNTER — Other Ambulatory Visit: Payer: Self-pay | Admitting: Gynecology

## 2013-05-31 ENCOUNTER — Ambulatory Visit (INDEPENDENT_AMBULATORY_CARE_PROVIDER_SITE_OTHER): Payer: Managed Care, Other (non HMO)

## 2013-05-31 DIAGNOSIS — Z13820 Encounter for screening for osteoporosis: Secondary | ICD-10-CM

## 2013-05-31 DIAGNOSIS — Z780 Asymptomatic menopausal state: Secondary | ICD-10-CM

## 2013-08-15 ENCOUNTER — Ambulatory Visit: Payer: Managed Care, Other (non HMO)

## 2013-08-15 ENCOUNTER — Ambulatory Visit (INDEPENDENT_AMBULATORY_CARE_PROVIDER_SITE_OTHER): Payer: Managed Care, Other (non HMO) | Admitting: Family Medicine

## 2013-08-15 VITALS — BP 120/80 | HR 62 | Temp 97.6°F | Resp 16 | Ht 66.0 in | Wt 165.0 lb

## 2013-08-15 DIAGNOSIS — R0602 Shortness of breath: Secondary | ICD-10-CM

## 2013-08-15 DIAGNOSIS — D649 Anemia, unspecified: Secondary | ICD-10-CM

## 2013-08-15 DIAGNOSIS — R05 Cough: Secondary | ICD-10-CM

## 2013-08-15 DIAGNOSIS — K219 Gastro-esophageal reflux disease without esophagitis: Secondary | ICD-10-CM

## 2013-08-15 DIAGNOSIS — R059 Cough, unspecified: Secondary | ICD-10-CM

## 2013-08-15 LAB — POCT CBC
Granulocyte percent: 50.9 % (ref 37–80)
HCT, POC: 36.5 % — AB (ref 37.7–47.9)
Hemoglobin: 11.1 g/dL — AB (ref 12.2–16.2)
Lymph, poc: 3.3 (ref 0.6–3.4)
MCH, POC: 23.7 pg — AB (ref 27–31.2)
MCHC: 30.4 g/dL — AB (ref 31.8–35.4)
MCV: 77.9 fL — AB (ref 80–97)
MID (cbc): 0.4 (ref 0–0.9)
MPV: 9.5 fL (ref 0–99.8)
POC Granulocyte: 3.8 (ref 2–6.9)
POC LYMPH PERCENT: 43.4 % (ref 10–50)
POC MID %: 5.7 % (ref 0–12)
Platelet Count, POC: 86 10*3/uL — AB (ref 142–424)
RBC: 4.68 M/uL (ref 4.04–5.48)
RDW, POC: 13.8 %
WBC: 7.5 10*3/uL (ref 4.6–10.2)

## 2013-08-15 LAB — IBC PANEL
%SAT: 6 % — ABNORMAL LOW (ref 20–55)
TIBC: 403 ug/dL (ref 250–470)
UIBC: 379 ug/dL (ref 125–400)

## 2013-08-15 LAB — FERRITIN: Ferritin: 6 ng/mL — ABNORMAL LOW (ref 10–291)

## 2013-08-15 LAB — IRON: Iron: 24 ug/dL — ABNORMAL LOW (ref 42–145)

## 2013-08-15 NOTE — Patient Instructions (Signed)
Gastroesophageal Reflux Disease, Adult  Gastroesophageal reflux disease (GERD) happens when acid from your stomach flows up into the esophagus. When acid comes in contact with the esophagus, the acid causes soreness (inflammation) in the esophagus. Over time, GERD may create small holes (ulcers) in the lining of the esophagus.  CAUSES   · Increased body weight. This puts pressure on the stomach, making acid rise from the stomach into the esophagus.  · Smoking. This increases acid production in the stomach.  · Drinking alcohol. This causes decreased pressure in the lower esophageal sphincter (valve or ring of muscle between the esophagus and stomach), allowing acid from the stomach into the esophagus.  · Late evening meals and a full stomach. This increases pressure and acid production in the stomach.  · A malformed lower esophageal sphincter.  Sometimes, no cause is found.  SYMPTOMS   · Burning pain in the lower part of the mid-chest behind the breastbone and in the mid-stomach area. This may occur twice a week or more often.  · Trouble swallowing.  · Sore throat.  · Dry cough.  · Asthma-like symptoms including chest tightness, shortness of breath, or wheezing.  DIAGNOSIS   Your caregiver may be able to diagnose GERD based on your symptoms. In some cases, X-rays and other tests may be done to check for complications or to check the condition of your stomach and esophagus.  TREATMENT   Your caregiver may recommend over-the-counter or prescription medicines to help decrease acid production. Ask your caregiver before starting or adding any new medicines.   HOME CARE INSTRUCTIONS   · Change the factors that you can control. Ask your caregiver for guidance concerning weight loss, quitting smoking, and alcohol consumption.  · Avoid foods and drinks that make your symptoms worse, such as:  · Caffeine or alcoholic drinks.  · Chocolate.  · Peppermint or mint flavorings.  · Garlic and onions.  · Spicy foods.  · Citrus fruits,  such as oranges, lemons, or limes.  · Tomato-based foods such as sauce, chili, salsa, and pizza.  · Fried and fatty foods.  · Avoid lying down for the 3 hours prior to your bedtime or prior to taking a nap.  · Eat small, frequent meals instead of large meals.  · Wear loose-fitting clothing. Do not wear anything tight around your waist that causes pressure on your stomach.  · Raise the head of your bed 6 to 8 inches with wood blocks to help you sleep. Extra pillows will not help.  · Only take over-the-counter or prescription medicines for pain, discomfort, or fever as directed by your caregiver.  · Do not take aspirin, ibuprofen, or other nonsteroidal anti-inflammatory drugs (NSAIDs).  SEEK IMMEDIATE MEDICAL CARE IF:   · You have pain in your arms, neck, jaw, teeth, or back.  · Your pain increases or changes in intensity or duration.  · You develop nausea, vomiting, or sweating (diaphoresis).  · You develop shortness of breath, or you faint.  · Your vomit is green, yellow, black, or looks like coffee grounds or blood.  · Your stool is red, bloody, or black.  These symptoms could be signs of other problems, such as heart disease, gastric bleeding, or esophageal bleeding.  MAKE SURE YOU:   · Understand these instructions.  · Will watch your condition.  · Will get help right away if you are not doing well or get worse.  Document Released: 02/03/2005 Document Revised: 07/19/2011 Document Reviewed: 11/13/2010  ExitCare® Patient   Information ©2014 [employer].  Diet for Gastroesophageal Reflux Disease, Adult  Reflux (acid reflux) is when acid from your stomach flows up into the esophagus. When acid comes in contact with the esophagus, the acid causes irritation and soreness (inflammation) in the esophagus. When reflux happens often or so severely that it causes damage to the esophagus, it is called gastroesophageal reflux disease (GERD). Nutrition therapy can help ease the discomfort of GERD.  FOODS OR DRINKS TO AVOID OR  LIMIT  · Smoking or chewing tobacco. Nicotine is one of the most potent stimulants to acid production in the gastrointestinal tract.  · Caffeinated and decaffeinated coffee and black tea.  · Regular or low-calorie carbonated beverages or energy drinks (caffeine-free carbonated beverages are allowed).    · Strong spices, such as black pepper, white pepper, red pepper, cayenne, curry powder, and chili powder.  · Peppermint or spearmint.  · Chocolate.  · High-fat foods, including meats and fried foods. Extra added fats including oils, butter, salad dressings, and nuts. Limit these to less than 8 tsp per day.  · Fruits and vegetables if they are not tolerated, such as citrus fruits or tomatoes.  · Alcohol.  · Any food that seems to aggravate your condition.  If you have questions regarding your diet, call your caregiver or a registered dietitian.  OTHER THINGS THAT MAY HELP GERD INCLUDE:   · Eating your meals slowly, in a relaxed setting.  · Eating 5 to 6 small meals per day instead of 3 large meals.  · Eliminating food for a period of time if it causes distress.  · Not lying down until 3 hours after eating a meal.  · Keeping the head of your bed raised 6 to 9 inches (15 to 23 cm) by using a foam wedge or blocks under the legs of the bed. Lying flat may make symptoms worse.  · Being physically active. Weight loss may be helpful in reducing reflux in overweight or obese adults.  · Wear loose fitting clothing  EXAMPLE MEAL PLAN  This meal plan is approximately 2,000 calories based on [employer]My[employer].gov meal planning guidelines.  Breakfast  · ½ cup cooked oatmeal.  · 1 cup strawberries.  · 1 cup low-fat milk.  · 1 oz almonds.  Snack  · 1 cup cucumber slices.  · 6 oz yogurt (made from low-fat or fat-free milk).  Lunch  · 2 slice whole-wheat bread.  · 2½ oz sliced turkey.  · 2 tsp mayonnaise.  · 1 cup blueberries.  · 1 cup snap peas.  Snack  · 6 whole-wheat crackers.  · 1 oz string cheese.  Dinner  · ½ cup brown rice.  · 1  cup mixed veggies.  · 1 tsp olive oil.  · 3 oz grilled fish.  Document Released: 04/26/2005 Document Revised: 07/19/2011 Document Reviewed: 03/12/2011  ExitCare® Patient Information ©2014 [employer].

## 2013-08-15 NOTE — Progress Notes (Signed)
Chief Complaint:  Chief Complaint  Patient presents with  . Cough    congestion x 3 months    HPI: Cynthia Nunez is a 63 y.o. female who is here for 2 month history congestion with history of chronic bronchitis, has had cough intermittently throughout day. Has a hx of sinus issues and laso GERD She feels "  Gurgling" in chest on right side, increase SOB going up stairs. She is still exercising. She is not SOB or wheezing when exercising. She notices it just going upstairs. Does not have orthopnea, PND, no SOB/DOE walking across a room, just up the stirs. She works out, uses treadmill, incline of about 2 at a clip of about 2-3 because she is reading. She is asymptomatic when doing this. Denies CP, palpitations or anemia.  Last CXR was 1.5 yr ago. Nonsmoker, no allergies, asthma. No heart history, no edema in legs, no orthopnea.  No family hx of sarcoidosis Requesting abx  Past Medical History  Diagnosis Date  . Constipation 10-18-11    chronic- is much improved with diet changes  . Herniated disc 10-18-11    Lumbar disc problem-no problems at  present time.  . Allergic rhinitis 10-18-11    no longer a problem  . Bronchitis, chronic 10-18-11    no recent problems  . Skin cyst 10-18-11    right  thigh-posterior near bend of knee  . Condyloma   . Hyperlipidemia   . Cough 10-18-11    not at this time  . GERD (gastroesophageal reflux disease) 10-18-11    controls with diet-rare occ.  Marland Kitchen Blood dyscrasia 10-18-11    Idiopathic thrombocytopenia purpura-no recent problems  . ITP (idiopathic thrombocytopenic purpura)   . Cancer     Right ovary-Srumal carcinoma  . Anal intraepithelial neoplasia I (AIN I), anterior midline, s/p excision [id number] 08/27/2011   Past Surgical History  Procedure Laterality Date  . Colonoscpy  2007  . Bunionectomy  10-18-11    both foot-retained pins  . Hand surgery  2000 - approximate    left hand-fracture repair  . Colonoscopy  04/28/2011    Procedure:  COLONOSCOPY;  Surgeon: Lear Ng, MD;  Location: [facility];  Service: Endoscopy;  Laterality: N/A;  . Abdominal hysterectomy  10-18-11    uterus removed and one ovary removed  . Examination under anesthesia  10/22/2011    Procedure: EXAM UNDER ANESTHESIA;  Surgeon: Adin Hector, MD;  Location: [facility];  Service: General;  Laterality: N/A;  . Breast surgery      Biopsy  . Oophorectomy      RSO  . Colon mass removed      Benign   History   Social History  . Marital Status: Married    Spouse Name: N/A    Number of Children: N/A  . Years of Education: N/A   Social History Main Topics  . Smoking status: Never Smoker   . Smokeless tobacco: Never Used  . Alcohol Use: Yes     Comment: occ. to rare social  . Drug Use: No  . Sexual Activity: Yes    Birth Control/ Protection: Surgical     Comment: HYST   Other Topics Concern  . None   Social History Narrative  . None   Family History  Problem Relation Age of Onset  . Kidney disease Mother   . Diabetes Paternal Grandmother   . Kidney disease Paternal Grandmother    Allergies  Allergen Reactions  .  Sudafed [Pseudoephedrine Hcl]     Passed out  . Miralax [Polyethylene Glycol]     constipation   Prior to Admission medications   Medication Sig Start Date End Date Taking? Authorizing Provider  albuterol (PROVENTIL) (2.5 MG/3ML) 0.083% nebulizer solution Take 2.5 mg by nebulization every 6 (six) hours as needed.    Historical Provider, MD     ROS: The patient denies fevers, chills, night sweats, unintentional weight loss, chest pain, palpitations, wheezing, dyspnea on exertion, nausea, vomiting, abdominal pain, dysuria, hematuria, melena, numbness, weakness, or tingling.   All other systems have been reviewed and were otherwise negative with the exception of those mentioned in the HPI and as above.    PHYSICAL EXAM: Filed Vitals:   08/15/13 1026  BP: 120/80  Pulse: 62  Temp: 97.6 F (36.4 C)  Resp: 16    Filed Vitals:   08/15/13 1026  Height: 5\' 6"  (1.676 m)  Weight: 165 lb (74.844 kg)   Body mass index is 26.64 kg/(m^2).  General: Alert, no acute distress HEENT:  Normocephalic, atraumatic, oropharynx patent. EOMI, PERRLA, tm nl, boggy nares, non tender sinuses Cardiovascular:  Regular rate and rhythm, no rubs murmurs or gallops.  No Carotid bruits, radial pulse intact. No pedal edema.  Respiratory: Clear to auscultation bilaterally.  No wheezes, rales, or rhonchi.  No cyanosis, no use of accessory musculature GI: No organomegaly, abdomen is soft and non-tender, positive bowel sounds.  No masses. Skin: No rashes. Neurologic: Facial musculature symmetric. Psychiatric: Patient is appropriate throughout our interaction. Lymphatic: No cervical lymphadenopathy Musculoskeletal: Gait intact. 5/5 UE and Jisela Merlino   LABS: Results for orders placed in visit on 08/15/13  POCT CBC      Result Value Ref Range   WBC 7.5  4.6 - 10.2 K/uL   Lymph, poc 3.3  0.6 - 3.4   POC LYMPH PERCENT 43.4  10 - 50 %L   MID (cbc) 0.4  0 - 0.9   POC MID % 5.7  0 - 12 %M   POC Granulocyte 3.8  2 - 6.9   Granulocyte percent 50.9  37 - 80 %G   RBC 4.68  4.04 - 5.48 M/uL   Hemoglobin 11.1 (*) 12.2 - 16.2 g/dL   HCT, POC 36.5 (*) 37.7 - 47.9 %   MCV 77.9 (*) 80 - 97 fL   MCH, POC 23.7 (*) 27 - 31.2 pg   MCHC 30.4 (*) 31.8 - 35.4 g/dL   RDW, POC 13.8     Platelet Count, POC 86 (*) 142 - 424 K/uL   MPV 9.5  0 - 99.8 fL     EKG/XRAY:   Primary read interpreted by Dr. Marin Comment at [facility]. Neg for acute infiltrate Neg for pneumo, effusion Increase vasc markings   ASSESSMENT/PLAN: Encounter Diagnoses  Name Primary?  . Cough Yes  . SOB (shortness of breath)   . GERD (gastroesophageal reflux disease)   . Anemia    Hgb was slightly low probably not significant enough to contribute to SOB-has had colonoscopy 2012 and was found to have an anal canal mass. Biopsy showed AIN-1 this was surgically removed and internal  hemorrhoid otherwise was normal , she declines rectal exam. Denies any B sxs Will get iron studies.  She has ITP-stable plt count per paitent at 81, she has been seen by [facility] in the past, Dr Denman George had referred her Cough may be related to GERD vs allergy sxs-will do a trial of prilosec x 2 weeks, then  if no improvement then add on nasocort and zyrtec.  Chest xray normal, f/u in 1 month for repeat CBC and rectal exam due to history.  F/u prn  Gross sideeffects, risk and benefits, and alternatives of medications d/w patient. Patient is aware that all medications have potential sideeffects and we are unable to predict every sideeffect or drug-drug interaction that may occur.  Glenford Bayley, DO 08/15/2013 1:27 PM

## 2013-08-16 ENCOUNTER — Telehealth: Payer: Self-pay | Admitting: Family Medicine

## 2013-08-16 ENCOUNTER — Encounter: Payer: Self-pay | Admitting: Family Medicine

## 2013-08-16 DIAGNOSIS — D509 Iron deficiency anemia, unspecified: Secondary | ICD-10-CM

## 2013-08-16 MED ORDER — IRON 325 (65 FE) MG PO TABS: 1.0000 | ORAL_TABLET | Freq: Two times a day (BID) | ORAL | Status: DC

## 2013-08-16 NOTE — Telephone Encounter (Signed)
Spoke to patient about labs, will return in 2 months, after taking iron supplements 325 mg BID x 2 month, if cause her to be too constipated then need to decrease to 325 mg daily.  We will do a rectal exam if Hgb cont to be low at that time and also hemosure due to prior history of anal canal mass. Biopsy showed AIN-1.

## 2013-10-19 ENCOUNTER — Ambulatory Visit (INDEPENDENT_AMBULATORY_CARE_PROVIDER_SITE_OTHER): Payer: Managed Care, Other (non HMO) | Admitting: Family Medicine

## 2013-10-19 ENCOUNTER — Encounter: Payer: Self-pay | Admitting: Family Medicine

## 2013-10-19 ENCOUNTER — Ambulatory Visit: Payer: Managed Care, Other (non HMO) | Admitting: Family Medicine

## 2013-10-19 VITALS — BP 130/80 | HR 55 | Temp 97.7°F | Resp 16 | Ht 66.0 in | Wt 166.8 lb

## 2013-10-19 DIAGNOSIS — D649 Anemia, unspecified: Secondary | ICD-10-CM

## 2013-10-19 DIAGNOSIS — K219 Gastro-esophageal reflux disease without esophagitis: Secondary | ICD-10-CM

## 2013-10-19 NOTE — Progress Notes (Signed)
 Chief Complaint:  Chief Complaint  Patient presents with  . Follow-up    HPI: Cynthia Nunez is a 63 y.o. female who is here for  Anemia f.u Doing well overall ,denies and CP, dizziness, SOB Cough has improved on PPI, still has it but minimal. Has not taken any allergy meds  Past Medical History  Diagnosis Date  . Constipation 10-18-11    chronic- is much improved with diet changes  . Herniated disc 10-18-11    Lumbar disc problem-no problems at  present time.  . Allergic rhinitis 10-18-11    no longer a problem  . Bronchitis, chronic 10-18-11    no recent problems  . Skin cyst 10-18-11    right  thigh-posterior near bend of knee  . Condyloma   . Hyperlipidemia   . Cough 10-18-11    not at this time  . GERD (gastroesophageal reflux disease) 10-18-11    controls with diet-rare occ.  Marland Kitchen Blood dyscrasia 10-18-11    Idiopathic thrombocytopenia purpura-no recent problems  . ITP (idiopathic thrombocytopenic purpura)   . Cancer     Right ovary-Srumal carcinoma  . Anal intraepithelial neoplasia I (AIN I), anterior midline, s/p excision [id number] 08/27/2011   Past Surgical History  Procedure Laterality Date  . Colonoscpy  2007  . Bunionectomy  10-18-11    both foot-retained pins  . Hand surgery  2000 - approximate    left hand-fracture repair  . Colonoscopy  04/28/2011    Procedure: COLONOSCOPY;  Surgeon: ar Ng, MD;  Location: [facility];  Service: Endoscopy;  Laterality: N/A;  . Abdominal hysterectomy  10-18-11    uterus removed and one ovary removed  . Examination under anesthesia  10/22/2011    Procedure: EXAM UNDER ANESTHESIA;  Surgeon: Adin Hector, MD;  Location: [facility];  Service: General;  Laterality: N/A;  . Breast surgery      Biopsy  . Oophorectomy      RSO  . Colon mass removed      Benign   History   Social History  . Marital Status: Married    Spouse Name: N/A    Number of Children: N/A  . Years of Education: N/A   Social History Main  Topics  . Smoking status: Never Smoker   . Smokeless tobacco: Never Used  . Alcohol Use: Yes     Comment: occ. to rare social  . Drug Use: No  . Sexual Activity: Yes    Birth Control/ Protection: Surgical     Comment: HYST   Other Topics Concern  . None   Social History Narrative  . None   Family History  Problem Relation Age of Onset  . Kidney disease Mother   . Diabetes Paternal Grandmother   . Kidney disease Paternal Grandmother    Allergies  Allergen Reactions  . Sudafed [Pseudoephedrine Hcl]     Passed out  . Miralax [Polyethylene Glycol]     constipation   Prior to Admission medications   Medication Sig Start Date End Date Taking? Authorizing Provider  Ferrous Sulfate (IRON) 325 (65 FE) MG TABS Take 1 tablet by mouth 2 (two) times daily. 08/16/13  Yes  P , DO  albuterol (PROVENTIL) (2.5 MG/3ML) 0.083% nebulizer solution Take 2.5 mg by nebulization every 6 (six) hours as needed.    Historical Provider, MD     ROS: The patient denies fevers, chills, night sweats, unintentional weight loss, chest pain, palpitations, wheezing, dyspnea on exertion,  nausea, vomiting, abdominal pain, dysuria, hematuria, melena, numbness, weakness, or tingling.   All other systems have been reviewed and were otherwise negative with the exception of those mentioned in the HPI and as above.    PHYSICAL EXAM: Filed Vitals:   10/19/13 1235  BP: 130/80  Pulse: 55  Temp: 97.7 F (36.5 C)  Resp: 16   Filed Vitals:   10/19/13 1235  Height: 5\' 6"  (1.676 m)  Weight: 166 lb 12.8 oz (75.66 kg)   Body mass index is 26.94 kg/(m^2).  General: Alert, no acute distress HEENT:  Normocephalic, atraumatic, oropharynx patent. EOMI, PERRLA Cardiovascular:  Regular rate and rhythm, no rubs murmurs or gallops.  No Carotid bruits, radial pulse intact. No pedal edema.  Respiratory: Clear to auscultation bilaterally.  No wheezes, rales, or rhonchi.  No cyanosis, no use of accessory musculature GI:  No organomegaly, abdomen is soft and non-tender, positive bowel sounds.  No masses. Skin: No rashes. Neurologic: Facial musculature symmetric. Psychiatric: Patient is appropriate throughout our interaction. Lymphatic: No cervical lymphadenopathy Musculoskeletal: Gait intact.   LABS: Results for orders placed in visit on 08/15/13  FERRITIN      Result Value Ref Range   Ferritin 6 (*) 10 - 291 ng/mL  IBC PANEL      Result Value Ref Range   UIBC 379  125 - 400 ug/dL   TIBC 403  250 - 470 ug/dL   %SAT 6 (*) 20 - 55 %  IRON      Result Value Ref Range   Iron 24 (*) 42 - 145 ug/dL  POCT CBC      Result Value Ref Range   WBC 7.5  4.6 - 10.2 K/uL   Lymph, poc 3.3  0.6 - 3.4   POC LYMPH PERCENT 43.4  10 - 50 %L   MID (cbc) 0.4  0 - 0.9   POC MID % 5.7  0 - 12 %M   POC Granulocyte 3.8  2 - 6.9   Granulocyte percent 50.9  37 - 80 %G   RBC 4.68  4.04 - 5.48 M/uL   Hemoglobin 11.1 (*) 12.2 - 16.2 g/dL   HCT, POC 36.5 (*) 37.7 - 47.9 %   MCV 77.9 (*) 80 - 97 fL   MCH, POC 23.7 (*) 27 - 31.2 pg   MCHC 30.4 (*) 31.8 - 35.4 g/dL   RDW, POC 13.8     Platelet Count, POC 86 (*) 142 - 424 K/uL   MPV 9.5  0 - 99.8 fL     EKG/XRAY:   Primary read interpreted by Dr. Marin Comment at [facility].   ASSESSMENT/PLAN: Encounter Diagnoses  Name Primary?  Marland Kitchen Anemia Yes  . GERD (gastroesophageal reflux disease)    Will get CBC and also iron level Will call with test results and see if she still needs to take supplemental iron BID or if ok to decrease to just multivitamin F/u prn   Gross sideeffects, risk and benefits, and alternatives of medications d/w patient. Patient is aware that all medications have potential sideeffects and we are unable to predict every sideeffect or drug-drug interaction that may occur.  , Jennings, DO 10/19/2013 12:58 PM

## 2013-10-20 LAB — CBC WITH DIFFERENTIAL/PLATELET
Basophils Absolute: 0.1 10*3/uL (ref 0.0–0.1)
Basophils Relative: 1 % (ref 0–1)
Eosinophils Absolute: 0.1 10*3/uL (ref 0.0–0.7)
Eosinophils Relative: 1 % (ref 0–5)
HCT: 38.3 % (ref 36.0–46.0)
Hemoglobin: 12.7 g/dL (ref 12.0–15.0)
Lymphocytes Relative: 44 % (ref 12–46)
Lymphs Abs: 2.9 10*3/uL (ref 0.7–4.0)
MCH: 25.1 pg — ABNORMAL LOW (ref 26.0–34.0)
MCHC: 33.2 g/dL (ref 30.0–36.0)
MCV: 75.7 fL — ABNORMAL LOW (ref 78.0–100.0)
Monocytes Absolute: 0.4 10*3/uL (ref 0.1–1.0)
Monocytes Relative: 6 % (ref 3–12)
Neutro Abs: 3.2 10*3/uL (ref 1.7–7.7)
Neutrophils Relative %: 48 % (ref 43–77)
Platelets: 84 10*3/uL — ABNORMAL LOW (ref 150–400)
RBC: 5.06 MIL/uL (ref 3.87–5.11)
RDW: 18.1 % — ABNORMAL HIGH (ref 11.5–15.5)
WBC: 6.6 10*3/uL (ref 4.0–10.5)

## 2013-10-20 LAB — IRON: Iron: 134 ug/dL (ref 42–145)

## 2013-10-24 ENCOUNTER — Encounter: Payer: Self-pay | Admitting: Family Medicine

## 2013-11-13 ENCOUNTER — Other Ambulatory Visit: Payer: Self-pay | Admitting: Family Medicine

## 2013-12-18 ENCOUNTER — Telehealth: Payer: Self-pay | Admitting: Family Medicine

## 2013-12-18 ENCOUNTER — Other Ambulatory Visit (INDEPENDENT_AMBULATORY_CARE_PROVIDER_SITE_OTHER): Payer: Managed Care, Other (non HMO) | Admitting: *Deleted

## 2013-12-18 DIAGNOSIS — D509 Iron deficiency anemia, unspecified: Secondary | ICD-10-CM

## 2013-12-18 DIAGNOSIS — R5383 Other fatigue: Principal | ICD-10-CM

## 2013-12-18 DIAGNOSIS — R5381 Other malaise: Secondary | ICD-10-CM

## 2013-12-18 LAB — POCT CBC
Granulocyte percent: 46.4 %G (ref 37–80)
HCT, POC: 42.2 % (ref 37.7–47.9)
Hemoglobin: 13.2 g/dL (ref 12.2–16.2)
Lymph, poc: 3.7 — AB (ref 0.6–3.4)
MCH, POC: 24.9 pg — AB (ref 27–31.2)
MCHC: 31.2 g/dL — AB (ref 31.8–35.4)
MCV: 80 fL (ref 80–97)
MID (cbc): 0.5 (ref 0–0.9)
MPV: 8.8 fL (ref 0–99.8)
POC Granulocyte: 3.7 (ref 2–6.9)
POC LYMPH PERCENT: 46.7 %L (ref 10–50)
POC MID %: 6.9 % (ref 0–12)
Platelet Count, POC: 83 10*3/uL — AB (ref 142–424)
RBC: 5.27 M/uL (ref 4.04–5.48)
RDW, POC: 16.1 %
WBC: 7.9 10*3/uL (ref 4.6–10.2)

## 2013-12-18 LAB — POCT URINALYSIS DIPSTICK
Bilirubin, UA: NEGATIVE
Glucose, UA: NEGATIVE
Nitrite, UA: NEGATIVE
Protein, UA: NEGATIVE
Spec Grav, UA: 1.025
Urobilinogen, UA: 0.2
pH, UA: 7

## 2013-12-18 LAB — POCT UA - MICROSCOPIC ONLY
Casts, Ur, LPF, POC: NEGATIVE
Crystals, Ur, HPF, POC: NEGATIVE
Mucus, UA: NEGATIVE
Yeast, UA: NEGATIVE

## 2013-12-18 NOTE — Telephone Encounter (Signed)
Feeling tired like she did when she was anemic and before taking iron supplements, she is wondering if she can have iron pills again. I advised her to drop by for Cbc, iron levels and UA and cx.

## 2013-12-19 LAB — IRON: Iron: 57 ug/dL (ref 42–145)

## 2013-12-20 ENCOUNTER — Telehealth: Payer: Self-pay | Admitting: Family Medicine

## 2013-12-20 DIAGNOSIS — N390 Urinary tract infection, site not specified: Secondary | ICD-10-CM

## 2013-12-20 MED ORDER — CIPROFLOXACIN HCL 500 MG PO TABS: 500.0000 mg | ORAL_TABLET | Freq: Two times a day (BID) | ORAL | Status: DC

## 2013-12-20 NOTE — Telephone Encounter (Signed)
LM to call me back. She has UTI, rx cipro.  Gross SEs left on message for patient.

## 2013-12-22 LAB — URINE CULTURE: Colony Count: 25000

## 2013-12-27 ENCOUNTER — Telehealth: Payer: Self-pay | Admitting: Family Medicine

## 2013-12-27 NOTE — Telephone Encounter (Signed)
Advise to stop  Cipro, she has had 5 days or so of th emeds, urine cx was negative

## 2014-02-18 ENCOUNTER — Encounter: Payer: Self-pay | Admitting: Gynecology

## 2014-03-12 ENCOUNTER — Ambulatory Visit (INDEPENDENT_AMBULATORY_CARE_PROVIDER_SITE_OTHER): Payer: Managed Care, Other (non HMO) | Admitting: Gynecology

## 2014-03-12 ENCOUNTER — Encounter: Payer: Self-pay | Admitting: Gynecology

## 2014-03-12 VITALS — BP 120/70 | Ht 66.0 in | Wt 164.0 lb

## 2014-03-12 DIAGNOSIS — N952 Postmenopausal atrophic vaginitis: Secondary | ICD-10-CM

## 2014-03-12 DIAGNOSIS — Z01419 Encounter for gynecological examination (general) (routine) without abnormal findings: Secondary | ICD-10-CM

## 2014-03-12 NOTE — Patient Instructions (Signed)
You may obtain a copy of any labs that were done today by logging onto MyChart as outlined in the instructions provided with your AVS (after visit summary). The office will not call with normal lab results but certainly if there are any significant abnormalities then we will contact you.   Health Maintenance, Female A healthy lifestyle and preventative care can promote health and wellness.  Maintain regular health, dental, and eye exams.  Eat a healthy diet. Foods like vegetables, fruits, whole grains, low-fat dairy products, and lean protein foods contain the nutrients you need without too many calories. Decrease your intake of foods high in solid fats, added sugars, and salt. Get information about a proper diet from your caregiver, if necessary.  Regular physical exercise is one of the most important things you can do for your health. Most adults should get at least 150 minutes of moderate-intensity exercise (any activity that increases your heart rate and causes you to sweat) each week. In addition, most adults need muscle-strengthening exercises on 2 or more days a week.   Maintain a healthy weight. The body mass index (BMI) is a screening tool to identify possible weight problems. It provides an estimate of body fat based on height and weight. Your caregiver can help determine your BMI, and can help you achieve or maintain a healthy weight. For adults 20 years and older:  A BMI below 18.5 is considered underweight.  A BMI of 18.5 to 24.9 is normal.  A BMI of 25 to 29.9 is considered overweight.  A BMI of 30 and above is considered obese.  Maintain normal blood lipids and cholesterol by exercising and minimizing your intake of saturated fat. Eat a balanced diet with plenty of fruits and vegetables. Blood tests for lipids and cholesterol should begin at age 61 and be repeated every 5 years. If your lipid or cholesterol levels are high, you are over 50, or you are a high risk for heart  disease, you may need your cholesterol levels checked more frequently.Ongoing high lipid and cholesterol levels should be treated with medicines if diet and exercise are not effective.  If you smoke, find out from your caregiver how to quit. If you do not use tobacco, do not start.  Lung cancer screening is recommended for adults aged 33 80 years who are at high risk for developing lung cancer because of a history of smoking. Yearly low-dose computed tomography (CT) is recommended for people who have at least a 30-pack-year history of smoking and are a current smoker or have quit within the past 15 years. A pack year of smoking is smoking an average of 1 pack of cigarettes a day for 1 year (for example: 1 pack a day for 30 years or 2 packs a day for 15 years). Yearly screening should continue until the smoker has stopped smoking for at least 15 years. Yearly screening should also be stopped for people who develop a health problem that would prevent them from having lung cancer treatment.  If you are pregnant, do not drink alcohol. If you are breastfeeding, be very cautious about drinking alcohol. If you are not pregnant and choose to drink alcohol, do not exceed 1 drink per day. One drink is considered to be 12 ounces (355 mL) of beer, 5 ounces (148 mL) of wine, or 1.5 ounces (44 mL) of liquor.  Avoid use of street drugs. Do not share needles with anyone. Ask for help if you need support or instructions about stopping  the use of drugs.  High blood pressure causes heart disease and increases the risk of stroke. Blood pressure should be checked at least every 1 to 2 years. Ongoing high blood pressure should be treated with medicines, if weight loss and exercise are not effective.  If you are 59 to 61 years old, ask your caregiver if you should take aspirin to prevent strokes.  Diabetes screening involves taking a blood sample to check your fasting blood sugar level. This should be done once every 3  years, after age 91, if you are within normal weight and without risk factors for diabetes. Testing should be considered at a younger age or be carried out more frequently if you are overweight and have at least 1 risk factor for diabetes.  Breast cancer screening is essential preventative care for women. You should practice "breast self-awareness." This means understanding the normal appearance and feel of your breasts and may include breast self-examination. Any changes detected, no matter how small, should be reported to a caregiver. Women in their 66s and 30s should have a clinical breast exam (CBE) by a caregiver as part of a regular health exam every 1 to 3 years. After age 101, women should have a CBE every year. Starting at age 100, women should consider having a mammogram (breast X-ray) every year. Women who have a family history of breast cancer should talk to their caregiver about genetic screening. Women at a high risk of breast cancer should talk to their caregiver about having an MRI and a mammogram every year.  Breast cancer gene (BRCA)-related cancer risk assessment is recommended for women who have family members with BRCA-related cancers. BRCA-related cancers include breast, ovarian, tubal, and peritoneal cancers. Having family members with these cancers may be associated with an increased risk for harmful changes (mutations) in the breast cancer genes BRCA1 and BRCA2. Results of the assessment will determine the need for genetic counseling and BRCA1 and BRCA2 testing.  The Pap test is a screening test for cervical cancer. Women should have a Pap test starting at age 57. Between ages 25 and 35, Pap tests should be repeated every 2 years. Beginning at age 37, you should have a Pap test every 3 years as long as the past 3 Pap tests have been normal. If you had a hysterectomy for a problem that was not cancer or a condition that could lead to cancer, then you no longer need Pap tests. If you are  between ages 50 and 76, and you have had normal Pap tests going back 10 years, you no longer need Pap tests. If you have had past treatment for cervical cancer or a condition that could lead to cancer, you need Pap tests and screening for cancer for at least 20 years after your treatment. If Pap tests have been discontinued, risk factors (such as a new sexual partner) need to be reassessed to determine if screening should be resumed. Some women have medical problems that increase the chance of getting cervical cancer. In these cases, your caregiver may recommend more frequent screening and Pap tests.  The human papillomavirus (HPV) test is an additional test that may be used for cervical cancer screening. The HPV test looks for the virus that can cause the cell changes on the cervix. The cells collected during the Pap test can be tested for HPV. The HPV test could be used to screen women aged 44 years and older, and should be used in women of any age  who have unclear Pap test results. After the age of 55, women should have HPV testing at the same frequency as a Pap test.  Colorectal cancer can be detected and often prevented. Most routine colorectal cancer screening begins at the age of 44 and continues through age 20. However, your caregiver may recommend screening at an earlier age if you have risk factors for colon cancer. On a yearly basis, your caregiver may provide home test kits to check for hidden blood in the stool. Use of a small camera at the end of a tube, to directly examine the colon (sigmoidoscopy or colonoscopy), can detect the earliest forms of colorectal cancer. Talk to your caregiver about this at age 86, when routine screening begins. Direct examination of the colon should be repeated every 5 to 10 years through age 13, unless early forms of pre-cancerous polyps or small growths are found.  Hepatitis C blood testing is recommended for all people born from [birth year] through 1963 and any  individual with known risks for hepatitis C.  Practice safe sex. Use condoms and avoid high-risk sexual practices to reduce the spread of sexually transmitted infections (STIs). Sexually active women aged 36 and younger should be checked for Chlamydia, which is a common sexually transmitted infection. Older women with new or multiple partners should also be tested for Chlamydia. Testing for other STIs is recommended if you are sexually active and at increased risk.  Osteoporosis is a disease in which the bones lose minerals and strength with aging. This can result in serious bone fractures. The risk of osteoporosis can be identified using a bone density scan. Women ages 20 and over and women at risk for fractures or osteoporosis should discuss screening with their caregivers. Ask your caregiver whether you should be taking a calcium supplement or vitamin D to reduce the rate of osteoporosis.  Menopause can be associated with physical symptoms and risks. Hormone replacement therapy is available to decrease symptoms and risks. You should talk to your caregiver about whether hormone replacement therapy is right for you.  Use sunscreen. Apply sunscreen liberally and repeatedly throughout the day. You should seek shade when your shadow is shorter than you. Protect yourself by wearing long sleeves, pants, a wide-brimmed hat, and sunglasses year round, whenever you are outdoors.  Notify your caregiver of new moles or changes in moles, especially if there is a change in shape or color. Also notify your caregiver if a mole is larger than the size of a pencil eraser.  Stay current with your immunizations. Document Released: 11/09/2010 Document Revised: 08/21/2012 Document Reviewed: 11/09/2010 [facility] Patient Information 2014 [employer].

## 2014-03-12 NOTE — Progress Notes (Signed)
Cynthia Nunez 1951/01/31 [id number]        63 y.o.  G0P0 for annual exam.  Several issues noted below.  Past medical history,surgical history, problem list, medications, allergies, family history and social history were all reviewed and documented as reviewed in the EPIC chart.  ROS:  12 system ROS performed with pertinent positives and negatives included in the history, assessment and plan.   Additional significant findings :  none   Exam: Kim Counsellor Vitals:   03/12/14 1449  BP: 120/70  Height: 5\' 6"  (1.676 m)  Weight: 164 lb (74.39 kg)   General appearance:  Normal affect, orientation and appearance. Skin: Grossly normal HEENT: Without gross lesions.  No cervical or supraclavicular adenopathy. Thyroid normal.  Lungs:  Clear without wheezing, rales or rhonchi Cardiac: RR, without RMG Abdominal:  Soft, nontender, without masses, guarding, rebound, organomegaly or hernia Breasts:  Examined lying and sitting without masses, retractions, discharge or axillary adenopathy. Pelvic:  Ext/BUS/vagina normal with generalized atrophic changes.  Adnexa  Without masses or tenderness    Anus and perineum  Normal   Rectovaginal  Normal sphincter tone without palpated masses or tenderness.    Assessment/Plan:  63 y.o. G0P0 female for annual exam.   1. 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 Follow up CT scan was negative for disease. Consultation with Dr. Ned Clines, gynecologic oncologist recommended no specific follow up. She reported less than 100 cases of these in the world literature at that time and did not feel given that it was limited to the ovary, negative washings, negative CA 125 and a negative follow up CT scan that anything further needs to be done unless patient developed symptoms. Patient is doing well now and we'll continue to monitor. She is without  significant menopausal symptoms such as hot flashes, night sweats, vaginal dryness or dyspareunia off HRT.  Continue to monitor. 2. Pap smear/HPV negative 2014.  Is also negative for both 2013.  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 No Pap smear done today. We'll plan at a 3-5 year interval per current screening guidelines. No history of abnormal Pap smears in the past. 3. DEXA 2015 normal. Repeat at 5 year interval.  Increase calcium vitamin D reviewed. Check vitamin D level today. 4. Mammography 02/2014. Continue with annual mammography. SBE monthly reviewed. 5. Colonoscopy 2012. Repeat at their recommended interval. 6. Health maintenance. Baseline CBC, has a metabolic panel lipid profile urinalysis TSH vitamin D ordered. Follow up in one year, sooner as needed. 7.       [name] MD, 3:14 PM 03/12/2014

## 2014-03-13 LAB — URINALYSIS W MICROSCOPIC + REFLEX CULTURE
Bacteria, UA: NONE SEEN
Bilirubin Urine: NEGATIVE
Casts: NONE SEEN
Crystals: NONE SEEN
GLUCOSE, UA: NEGATIVE mg/dL
HGB URINE DIPSTICK: NEGATIVE
Ketones, ur: NEGATIVE mg/dL
NITRITE: NEGATIVE
PROTEIN: NEGATIVE mg/dL
Specific Gravity, Urine: 1.02 (ref 1.005–1.030)
UROBILINOGEN UA: 0.2 mg/dL (ref 0.0–1.0)
pH: 6.5 (ref 5.0–8.0)

## 2014-03-13 LAB — CBC WITH DIFFERENTIAL/PLATELET
BASOS ABS: 0 10*3/uL (ref 0.0–0.1)
Basophils Relative: 0 % (ref 0–1)
Eosinophils Absolute: 0.1 10*3/uL (ref 0.0–0.7)
Eosinophils Relative: 1 % (ref 0–5)
HCT: 39.4 % (ref 36.0–46.0)
Hemoglobin: 12.7 g/dL (ref 12.0–15.0)
LYMPHS PCT: 40 % (ref 12–46)
Lymphs Abs: 3.2 10*3/uL (ref 0.7–4.0)
MCH: 25.5 pg — ABNORMAL LOW (ref 26.0–34.0)
MCHC: 32.2 g/dL (ref 30.0–36.0)
MCV: 79.1 fL (ref 78.0–100.0)
Monocytes Absolute: 0.6 10*3/uL (ref 0.1–1.0)
Monocytes Relative: 7 % (ref 3–12)
NEUTROS ABS: 4.2 10*3/uL (ref 1.7–7.7)
NEUTROS PCT: 52 % (ref 43–77)
PLATELETS: 88 10*3/uL — AB (ref 150–400)
RBC: 4.98 MIL/uL (ref 3.87–5.11)
RDW: 15.3 % (ref 11.5–15.5)
WBC: 8 10*3/uL (ref 4.0–10.5)

## 2014-03-13 LAB — COMPREHENSIVE METABOLIC PANEL
ALBUMIN: 4 g/dL (ref 3.5–5.2)
ALT: 14 U/L (ref 0–35)
AST: 19 U/L (ref 0–37)
Alkaline Phosphatase: 105 U/L (ref 39–117)
BUN: 11 mg/dL (ref 6–23)
CHLORIDE: 103 meq/L (ref 96–112)
CO2: 29 mEq/L (ref 19–32)
CREATININE: 0.78 mg/dL (ref 0.50–1.10)
Calcium: 9.4 mg/dL (ref 8.4–10.5)
Glucose, Bld: 89 mg/dL (ref 70–99)
POTASSIUM: 4.3 meq/L (ref 3.5–5.3)
SODIUM: 140 meq/L (ref 135–145)
Total Bilirubin: 0.4 mg/dL (ref 0.2–1.2)
Total Protein: 6.9 g/dL (ref 6.0–8.3)

## 2014-03-13 LAB — LIPID PANEL
Cholesterol: 201 mg/dL — ABNORMAL HIGH (ref 0–200)
HDL: 69 mg/dL (ref >39)
LDL Cholesterol: 122 mg/dL — ABNORMAL HIGH (ref 0–99)
Total CHOL/HDL Ratio: 2.9 Ratio
Triglycerides: 52 mg/dL (ref <150)
VLDL: 10 mg/dL (ref 0–40)

## 2014-03-13 LAB — VITAMIN D 25 HYDROXY (VIT D DEFICIENCY, FRACTURES): VIT D 25 HYDROXY: 23 ng/mL — AB (ref 30–89)

## 2014-03-13 LAB — TSH: TSH: 1.265 u[IU]/mL (ref 0.350–4.500)

## 2014-03-15 ENCOUNTER — Other Ambulatory Visit: Payer: Self-pay | Admitting: *Deleted

## 2014-03-15 MED ORDER — SULFAMETHOXAZOLE-TRIMETHOPRIM 800-160 MG PO TABS: 1.0000 | ORAL_TABLET | Freq: Two times a day (BID) | ORAL | Status: DC

## 2014-03-15 NOTE — Progress Notes (Signed)
Verbally Dr Phineas Real stated 3 days . KW CMA

## 2014-03-16 LAB — URINE CULTURE

## 2014-03-20 ENCOUNTER — Other Ambulatory Visit: Payer: Self-pay | Admitting: Gynecology

## 2014-03-20 DIAGNOSIS — E7800 Pure hypercholesterolemia, unspecified: Secondary | ICD-10-CM

## 2014-03-20 DIAGNOSIS — E559 Vitamin D deficiency, unspecified: Secondary | ICD-10-CM

## 2014-10-30 ENCOUNTER — Ambulatory Visit (INDEPENDENT_AMBULATORY_CARE_PROVIDER_SITE_OTHER): Payer: Managed Care, Other (non HMO) | Admitting: Gynecology

## 2014-10-30 ENCOUNTER — Encounter: Payer: Self-pay | Admitting: Gynecology

## 2014-10-30 ENCOUNTER — Other Ambulatory Visit: Payer: Managed Care, Other (non HMO)

## 2014-10-30 VITALS — BP 118/76

## 2014-10-30 DIAGNOSIS — L298 Other pruritus: Secondary | ICD-10-CM

## 2014-10-30 DIAGNOSIS — R8290 Unspecified abnormal findings in urine: Secondary | ICD-10-CM

## 2014-10-30 DIAGNOSIS — E559 Vitamin D deficiency, unspecified: Secondary | ICD-10-CM

## 2014-10-30 DIAGNOSIS — N898 Other specified noninflammatory disorders of vagina: Secondary | ICD-10-CM

## 2014-10-30 DIAGNOSIS — E7800 Pure hypercholesterolemia, unspecified: Secondary | ICD-10-CM

## 2014-10-30 LAB — LIPID PANEL
CHOL/HDL RATIO: 2.7 ratio
CHOLESTEROL: 199 mg/dL (ref 0–200)
HDL: 75 mg/dL (ref >=46)
LDL Cholesterol: 113 mg/dL — ABNORMAL HIGH (ref 0–99)
TRIGLYCERIDES: 56 mg/dL (ref <150)
VLDL: 11 mg/dL (ref 0–40)

## 2014-10-30 LAB — WET PREP FOR TRICH, YEAST, CLUE
Clue Cells Wet Prep HPF POC: NONE SEEN
Trich, Wet Prep: NONE SEEN
Yeast Wet Prep HPF POC: NONE SEEN

## 2014-10-30 LAB — URINALYSIS W MICROSCOPIC + REFLEX CULTURE
BILIRUBIN URINE: NEGATIVE
Casts: NONE SEEN
Crystals: NONE SEEN
GLUCOSE, UA: NEGATIVE mg/dL
Ketones, ur: NEGATIVE mg/dL
NITRITE: NEGATIVE
PH: 7 (ref 5.0–8.0)
PROTEIN: NEGATIVE mg/dL
RBC / HPF: NONE SEEN RBC/hpf (ref <3)
UROBILINOGEN UA: 0.2 mg/dL (ref 0.0–1.0)

## 2014-10-30 MED ORDER — FLUCONAZOLE 150 MG PO TABS: 150.0000 mg | ORAL_TABLET | Freq: Once | ORAL | Status: DC

## 2014-10-30 MED ORDER — SULFAMETHOXAZOLE-TRIMETHOPRIM 800-160 MG PO TABS: 1.0000 | ORAL_TABLET | Freq: Two times a day (BID) | ORAL | Status: DC

## 2014-10-30 NOTE — Patient Instructions (Signed)
Take the antibiotic pill twice daily for 3 days. Take the Diflucan pill once. Follow up if your symptoms persist or recur.

## 2014-10-30 NOTE — Addendum Note (Signed)
Addended by: Olevia Perches on: 10/30/2014 04:22 PM   Modules accepted: Orders

## 2014-10-30 NOTE — Progress Notes (Signed)
Cynthia Nunez Oct 27, 1950 [id number]        63 y.o.  G0P0 Presents to have lab work done and noted that she was having some vaginal itching this morning as well as on and off odor in her urine. She is unsure whether is related to food that she is eating or if she has early urinary tract infection. She is not having frequency dysuria fever or chills. No significant vaginal discharge.  Past medical history,surgical history, problem list, medications, allergies, family history and social history were all reviewed and documented in the EPIC chart.  Directed ROS with pertinent positives and negatives documented in the history of present illness/assessment and plan.  Exam:  Kim assistant Filed Vitals:   10/30/14 1605  BP: 118/76   General appearance:  Normal Abdomen soft nontender without masses guarding rebound Pelvic external BUS vagina with slight white discharge. Bimanual without masses or tenderness.  Assessment/Plan:  64 y.o. G0P0 with above history and physical.  Wet prep is negative. Urinalysis shows 0-2 WBC with rare bacteria. Going to cover her as an early UTI with Septra DS 1 by mouth twice a day 3 days and one Diflucan 150 mg pill given her itching symptoms. Follow up if symptoms persist, worsen or recur.    [name] MD, 4:19 PM 10/30/2014

## 2014-10-31 LAB — VITAMIN D 25 HYDROXY (VIT D DEFICIENCY, FRACTURES): Vit D, 25-Hydroxy: 39 ng/mL (ref 30–100)

## 2014-11-01 LAB — URINE CULTURE
Colony Count: NO GROWTH
Organism ID, Bacteria: NO GROWTH

## 2014-11-04 ENCOUNTER — Other Ambulatory Visit: Payer: Self-pay | Admitting: Gynecology

## 2015-03-05 ENCOUNTER — Encounter: Payer: Self-pay | Admitting: Gynecology

## 2015-03-24 ENCOUNTER — Ambulatory Visit (INDEPENDENT_AMBULATORY_CARE_PROVIDER_SITE_OTHER): Payer: Managed Care, Other (non HMO) | Admitting: Family Medicine

## 2015-03-24 ENCOUNTER — Ambulatory Visit (INDEPENDENT_AMBULATORY_CARE_PROVIDER_SITE_OTHER): Payer: Managed Care, Other (non HMO)

## 2015-03-24 VITALS — BP 140/80 | HR 83 | Temp 98.6°F | Resp 16 | Ht 66.0 in | Wt 161.2 lb

## 2015-03-24 DIAGNOSIS — R05 Cough: Secondary | ICD-10-CM

## 2015-03-24 DIAGNOSIS — R059 Cough, unspecified: Secondary | ICD-10-CM

## 2015-03-24 DIAGNOSIS — J209 Acute bronchitis, unspecified: Secondary | ICD-10-CM | POA: Diagnosis not present

## 2015-03-24 MED ORDER — AZITHROMYCIN 250 MG PO TABS: ORAL_TABLET | ORAL | Status: DC

## 2015-03-24 MED ORDER — HYDROCODONE-HOMATROPINE 5-1.5 MG/5ML PO SYRP: 5.0000 mL | ORAL_SOLUTION | Freq: Three times a day (TID) | ORAL | Status: DC | PRN

## 2015-03-24 NOTE — Progress Notes (Signed)
This chart was scribed for Robyn Haber, MD by Moises Blood, medical scribe at [facility].The patient was seen in exam room 1 and the patient's care was started at 9:41 AM.  Patient ID: Cynthia Nunez MRN: [id number], DOB: 28-Jan-1951, 64 y.o. Date of Encounter: 03/24/2015  Primary Physician: Philis Fendt, MD  Chief Complaint:  Chief Complaint  Patient presents with  . Cough    x 2 days   . Eye Pain    x 2 days   . Headache    x 2 days   . hears rattling in chest    x last night, pt. says when she breathes sometimes  . Chills    x 2 days  . gets hot    x 2 days     HPI:  Cynthia Nunez is a 64 y.o. female who presents to [facility] complaining of cough with headache, sweats, chills, and ear pain that started 2 days ago. She noticed that when she breathes, she hears a rattling in her chest that started yesterday.   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   Past Medical History  Diagnosis Date  . Constipation 10-18-11    chronic- is much improved with diet changes  . Herniated disc 10-18-11    Lumbar disc problem-no problems at  present time.  . Allergic rhinitis 10-18-11    no longer a problem  . Bronchitis, chronic ([place]) 10-18-11    no recent problems  . Skin cyst 10-18-11    right  thigh-posterior near bend of knee  . Condyloma   . Cough 10-18-11    not at this time  . GERD (gastroesophageal reflux disease) 10-18-11    controls with diet-rare occ.  Marland Kitchen Blood dyscrasia 10-18-11    Idiopathic thrombocytopenia purpura-no recent problems  . ITP (idiopathic thrombocytopenic purpura)   . Cancer ([place])     Right ovary-Srumal carcinoma  . Anal intraepithelial neoplasia I (AIN I), anterior midline, s/p excision [id number] 08/27/2011     Home Meds: Prior to Admission medications   Medication Sig Start Date End Date Taking? Authorizing Provider  Cholecalciferol (VITAMIN D PO) Take by mouth.   Yes Historical Provider, MD    Allergies:    Allergies  Allergen Reactions  . Sudafed [Pseudoephedrine Hcl]     Passed out  . Fluconazole     Patient reported. "swelling in joints"  . Sulfamethizole     Patient reported "swelling in joints".  Merril Abbe [Polyethylene Glycol]     constipation    Social History   Social History  . Marital Status: Married    Spouse Name: N/A  . Number of Children: N/A  . Years of Education: N/A   Occupational History  . Not on file.   Social History Main Topics  . Smoking status: Never Smoker   . Smokeless tobacco: Never Used  . Alcohol Use: Yes     Comment: occ. to rare social  . Drug Use: No  . Sexual Activity: Yes    Birth Control/ Protection: Surgical     Comment: HYST   Other Topics Concern  . Not on file   Social History Narrative     Review of Systems: Constitutional: negative for fever, weight changes, or fatigue; positive for night sweats and chills HEENT: negative for vision changes, hearing loss, congestion, rhinorrhea, ST, epistaxis, or sinus pressure; positive for ear pain Cardiovascular: negative for chest pain or palpitations Respiratory:  negative for hemoptysis, wheezing, shortness of breath, or cough; positive for cough Abdominal: negative for abdominal pain, nausea, vomiting, diarrhea, or constipation Dermatological: negative for rash Neurologic: negative for dizziness, or syncope; positive for headache All other systems reviewed and are otherwise negative with the exception to those above and in the HPI.  Physical Exam: Blood pressure 140/80, pulse 83, temperature 98.6 F (37 C), temperature source Oral, resp. rate 16, height 5\' 6"  (1.676 m), weight 161 lb 3.2 oz (73.12 kg), SpO2 98 %., Body mass index is 26.03 kg/(m^2). General: Well developed, well nourished, in no acute distress. Head: Normocephalic, atraumatic, eyes without discharge, sclera non-icteric, nares are without discharge. Bilateral auditory canals clear, TM's are without perforation, pearly  grey and translucent with reflective cone of light bilaterally. Throat mild erythematous Neck: Supple. No thyromegaly. Full ROM. No lymphadenopathy. Lungs: Clear bilaterally to auscultation without wheezes. Rhonchi and rales bilaterally Heart: RRR with S1 S2. No murmurs, rubs, or gallops appreciated. Abdomen: Soft, non-tender, non-distended with normoactive bowel sounds. No hepatomegaly. No rebound/guarding. No obvious abdominal masses. Msk:  Strength and tone normal for age. Extremities/Skin: Warm and dry. No clubbing or cyanosis. No edema. No rashes or suspicious lesions. Neuro: Alert and oriented X 3. Moves all extremities spontaneously. Gait is normal. CNII-XII grossly in tact. Psych:  Responds to questions appropriately with a normal affect.   [facility] reading (PRIMARY) by Dr. Joseph Art : xray chest: heavy bronchial markings   ASSESSMENT AND PLAN:  64 y.o. year old female with  This chart was scribed in my presence and reviewed by me personally.    ICD-9-CM ICD-10-CM   1. Cough 786.2 R05 DG Chest 2 View  2. Acute bronchitis, unspecified organism 466.0 J20.9 azithromycin (ZITHROMAX) 250 MG tablet     HYDROcodone-homatropine (HYCODAN) 5-1.5 MG/5ML syrup     Signed, Robyn Haber, MD    By signing my name below, I, Moises Blood, attest that this documentation has been prepared under the direction and in the presence of Robyn Haber, MD. Electronically Signed: Moises Blood, Scribe. 03/24/2015 , 9:41 AM .  Signed, Robyn Haber, MD 03/24/2015 9:41 AM

## 2015-03-24 NOTE — Patient Instructions (Signed)

## 2015-04-05 ENCOUNTER — Ambulatory Visit (INDEPENDENT_AMBULATORY_CARE_PROVIDER_SITE_OTHER): Payer: Managed Care, Other (non HMO)

## 2015-04-05 ENCOUNTER — Ambulatory Visit (INDEPENDENT_AMBULATORY_CARE_PROVIDER_SITE_OTHER): Payer: Managed Care, Other (non HMO) | Admitting: Emergency Medicine

## 2015-04-05 VITALS — BP 126/80 | HR 58 | Temp 98.0°F | Resp 16 | Ht 66.0 in | Wt 164.2 lb

## 2015-04-05 DIAGNOSIS — R059 Cough, unspecified: Secondary | ICD-10-CM

## 2015-04-05 DIAGNOSIS — R9389 Abnormal findings on diagnostic imaging of other specified body structures: Secondary | ICD-10-CM

## 2015-04-05 DIAGNOSIS — R938 Abnormal findings on diagnostic imaging of other specified body structures: Secondary | ICD-10-CM

## 2015-04-05 DIAGNOSIS — R05 Cough: Secondary | ICD-10-CM | POA: Diagnosis not present

## 2015-04-05 NOTE — Patient Instructions (Signed)

## 2015-04-05 NOTE — Progress Notes (Signed)
Patient ID: Cynthia Nunez, female   DOB: 08/08/1950, 64 y.o.   MRN: [id number]    This chart was scribed for Cynthia Jordan, MD by [facility], medical scribe at [facility].The patient was seen in exam room 10 and the patient's care was started at 1:01 PM.  Chief Complaint:  Chief Complaint  Patient presents with  . Follow-up    bronchitis    HPI: [name] is a 64 y.o. female who reports to [facility] today for a follow up regarding bronchitis. Still coughing frequently, which is producing a dark yellow phlegm. No fever but having occasional chills, sweats which she attributes to hot flashes. Initially seen on 11/14, chest x-ray showed heavy bronchial markings. Diagnosed with bronchitis and given azithromycin and hycodan for the cough.  Past Medical History  Diagnosis Date  . Constipation 10-18-11    chronic- is much improved with diet changes  . Herniated disc 10-18-11    Lumbar disc problem-no problems at  present time.  . Allergic rhinitis 10-18-11    no longer a problem  . Bronchitis, chronic ([place]) 10-18-11    no recent problems  . Skin cyst 10-18-11    right  thigh-posterior near bend of knee  . Condyloma   . Cough 10-18-11    not at this time  . GERD (gastroesophageal reflux disease) 10-18-11    controls with diet-rare occ.  Marland Kitchen Blood dyscrasia 10-18-11    Idiopathic thrombocytopenia purpura-no recent problems  . ITP (idiopathic thrombocytopenic purpura)   . Cancer ([place])     Right ovary-Srumal carcinoma  . Anal intraepithelial neoplasia I (AIN I), anterior midline, s/p excision [id number] 08/27/2011   Past Surgical History  Procedure Laterality Date  . Colonoscpy  2007  . Bunionectomy  10-18-11    both foot-retained pins  . Hand surgery  2000 - approximate    left hand-fracture repair  . Colonoscopy  04/28/2011    Procedure: COLONOSCOPY;  Surgeon: Lear Ng, MD;  Location: [facility];  Service: Endoscopy;  Laterality: N/A;  . Abdominal hysterectomy   10-18-11    uterus removed and one ovary removed  . Examination under anesthesia  10/22/2011    Procedure: EXAM UNDER ANESTHESIA;  Surgeon: Adin Hector, MD;  Location: [facility];  Service: General;  Laterality: N/A;  . Breast surgery      Biopsy  . Oophorectomy      RSO  . Colon mass removed      Benign   Social History   Social History  . Marital Status: Married    Spouse Name: N/A  . Number of Children: N/A  . Years of Education: N/A   Social History Main Topics  . Smoking status: Never Smoker   . Smokeless tobacco: Never Used  . Alcohol Use: Yes     Comment: occ. to rare social  . Drug Use: No  . Sexual Activity: Yes    Birth Control/ Protection: Surgical     Comment: HYST   Other Topics Concern  . None   Social History Narrative   Family History  Problem Relation Age of Onset  . Kidney disease Mother   . Diabetes Paternal Grandmother   . Kidney disease Paternal Grandmother    Allergies  Allergen Reactions  . Sudafed [Pseudoephedrine Hcl]     Passed out  . Fluconazole     Patient reported. "swelling in joints"  . Sulfamethizole     Patient reported "swelling in joints".  Marland Kitchen  Miralax [Polyethylene Glycol]     constipation   Prior to Admission medications   Medication Sig Start Date End Date Taking? Authorizing Provider  azithromycin (ZITHROMAX) 250 MG tablet Take 2 tabs PO x 1 dose, then 1 tab PO QD x 4 days 03/24/15  Yes Robyn Haber, MD  Cholecalciferol (VITAMIN D PO) Take by mouth.   Yes Historical Provider, MD  HYDROcodone-homatropine (HYCODAN) 5-1.5 MG/5ML syrup Take 5 mLs by mouth every 8 (eight) hours as needed for cough. 03/24/15  Yes Robyn Haber, MD    ROS: The patient denies fevers, chills, night sweats, unintentional weight loss, chest pain, palpitations, wheezing, dyspnea on exertion, nausea, vomiting, abdominal pain, dysuria, hematuria, melena, numbness, weakness, or tingling. Positive for cough.  All other systems have been reviewed and  were otherwise negative with the exception of those mentioned in the HPI and as above.    PHYSICAL EXAM: Filed Vitals:   04/05/15 1254  BP: 126/80  Pulse: 58  Temp: 98 F (36.7 C)  Resp: 16   Body mass index is 26.52 kg/(m^2).  General: Alert, no acute distress HEENT:  Normocephalic, atraumatic, oropharynx patent. Eye: Juliette Mangle [facility] Cardiovascular:  Regular rate and rhythm, no rubs murmurs or gallops.  No Carotid bruits, radial pulse intact. No pedal edema.  Respiratory: Clear to auscultation bilaterally.  No wheezes, rales, or rhonchi.  No cyanosis, no use of accessory musculature Abdominal: No organomegaly, abdomen is soft and non-tender, positive bowel sounds.  No masses. Musculoskeletal: Gait intact. No edema, tenderness Skin: No rashes. Neurologic: Facial musculature symmetric. Psychiatric: Patient acts appropriately throughout our interaction. Lymphatic: No cervical or submandibular lymphadenopathy Genitourinary/Anorectal: No acute findings  LABS: Results for orders placed or performed in visit on 10/30/14  WET PREP FOR [place], YEAST, CLUE  Result Value Ref Range   Yeast Wet Prep HPF POC NONE SEEN NONE SEEN   Trich, Wet Prep NONE SEEN NONE SEEN   Clue Cells Wet Prep HPF POC NONE SEEN NONE SEEN   WBC, Wet Prep HPF POC MOD (A) NONE SEEN  Urine culture  Result Value Ref Range   Colony Count NO GROWTH    Organism ID, Bacteria NO GROWTH   Urinalysis w microscopic + reflex cultur  Result Value Ref Range   Color, Urine YELLOW YELLOW   APPearance CLEAR CLEAR   Specific Gravity, Urine <1.005 (L) 1.005 - 1.030   pH 7.0 5.0 - 8.0   Glucose, UA NEG NEG mg/dL   Bilirubin Urine NEG NEG   Ketones, ur NEG NEG mg/dL   Hgb urine dipstick TRACE (A) NEG   Protein, ur NEG NEG mg/dL   Urobilinogen, UA 0.2 0.0 - 1.0 mg/dL   Nitrite NEG NEG   Leukocytes, UA MOD (A) NEG   Squamous Epithelial / LPF RARE RARE   Crystals NONE SEEN NONE SEEN   Casts NONE SEEN NONE SEEN   WBC, UA 0-2 <3  WBC/hpf   RBC / HPF NONE SEEN <3 RBC/hpf   Bacteria, UA RARE RARE   Urine-Other SEE NOTE (A)   Vitamin D (25 hydroxy)  Result Value Ref Range   Vit D, 25-Hydroxy 39 30 - 100 ng/mL  Lipid Profile  Result Value Ref Range   Cholesterol 199 0 - 200 mg/dL   Triglycerides 56 <150 mg/dL   HDL 75 >=46 mg/dL   Total CHOL/HDL Ratio 2.7 Ratio   VLDL 11 0 - 40 mg/dL   LDL Cholesterol 113 (H) 0 - 99 mg/dL    EKG/XRAY:  Primary read interpreted by Dr. Everlene Farrier at [facility].  NAD  ASSESSMENT/PLAN: CXR clear. No further treatment necessary.I personally performed the services described in this documentation, which was scribed in my presence. The recorded information has been reviewed and is accurate. Gross sideeffects, risk and benefits, and alternatives of medications d/w patient. Patient is aware that all medications have potential sideeffects and we are unable to predict every sideeffect or drug-drug interaction that may occur.  By signing my name below, I, Nadim Abuhashem, attest that this documentation has been prepared under the direction and in the presence of Cynthia Jordan, MD.  Electronically Signed: Lora Havens, medical scribe. 04/05/2015, 1:01 PM.  Arlyss Queen MD 04/05/2015 1:01 PM

## 2015-04-17 ENCOUNTER — Ambulatory Visit (INDEPENDENT_AMBULATORY_CARE_PROVIDER_SITE_OTHER): Payer: Managed Care, Other (non HMO) | Admitting: Gynecology

## 2015-04-17 ENCOUNTER — Other Ambulatory Visit (HOSPITAL_COMMUNITY)
Admission: RE | Admit: 2015-04-17 | Discharge: 2015-04-17 | Disposition: A | Discharge status: Home / Self Care | Payer: Managed Care, Other (non HMO) | Source: Ambulatory Visit | Attending: Gynecology | Admitting: Gynecology

## 2015-04-17 ENCOUNTER — Encounter: Payer: Self-pay | Admitting: Gynecology

## 2015-04-17 VITALS — BP 122/76 | Ht 66.0 in | Wt 162.0 lb

## 2015-04-17 DIAGNOSIS — Z13220 Encounter for screening for lipoid disorders: Secondary | ICD-10-CM

## 2015-04-17 DIAGNOSIS — Z01419 Encounter for gynecological examination (general) (routine) without abnormal findings: Secondary | ICD-10-CM

## 2015-04-17 DIAGNOSIS — Z1329 Encounter for screening for other suspected endocrine disorder: Secondary | ICD-10-CM

## 2015-04-17 DIAGNOSIS — Z1321 Encounter for screening for nutritional disorder: Secondary | ICD-10-CM

## 2015-04-17 LAB — TSH: TSH: 1.053 u[IU]/mL (ref 0.350–4.500)

## 2015-04-17 NOTE — Progress Notes (Signed)
Cynthia Nunez 1950-07-01 [id number]        64 y.o.  G0P0  No LMP recorded. Patient has had a hysterectomy. for annual exam.  Doing well without complaints.  Past medical history,surgical history, problem list, medications, allergies, family history and social history were all reviewed and documented as reviewed in the EPIC chart.  ROS:  Performed with pertinent positives and negatives included in the history, assessment and plan.   Additional significant findings :  none   Exam: Kim Counsellor Vitals:   04/17/15 1503  BP: 122/76  Height: 5\' 6"  (1.676 m)  Weight: 162 lb (73.483 kg)   General appearance:  Normal affect, orientation and appearance. Skin: Grossly normal HEENT: Without gross lesions.  No cervical or supraclavicular adenopathy. Thyroid normal.  Lungs:  Clear without wheezing, rales or rhonchi Cardiac: RR, without RMG Abdominal:  Soft, nontender, without masses, guarding, rebound, organomegaly or hernia Breasts:  Examined lying and sitting without masses, retractions, discharge or axillary adenopathy. Pelvic:  Ext/BUS/vagina normal. Pap smear of vaginal cuff done  Adnexa  Without masses or tenderness    Anus and perineum  Normal   Rectovaginal  Normal sphincter tone without palpated masses or tenderness.    Assessment/Plan:  64 y.o. G0P0 female for annual exam.   1. 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 Follow up CT was negative. Consultation with gynecologic oncologist recommended no specific follow up. Less than 100 cases in the literature. With a negative washings CA-125 pathology limited to the ovary and a negative follow up CT scan that nothing further needs to be done. Patient overall is doing well without significant hot flushes, night sweats or vaginal dryness. 2. Pap smear/HPV negative 2014. Pap smear done today. Does have a history of anal intraepithelial  neoplasia grade 1 2013. 3. Colonoscopy 2012. Repeat at their recommended interval. 4. DEXA 2015 normal. Plan repeat at 5 year interval. 5. Health maintenance. CBC, comprehensive metabolic panel, lipid profile, urinalysis, TSH, vitamin D ordered. Follow up in one year, sooner as needed.   [name] MD, 3:37 PM 04/17/2015

## 2015-04-17 NOTE — Addendum Note (Signed)
Addended by: Nelva Nay on: 04/17/2015 03:57 PM   Modules accepted: Orders

## 2015-04-17 NOTE — Patient Instructions (Signed)

## 2015-04-18 LAB — CBC WITH DIFFERENTIAL/PLATELET
Basophils Absolute: 0.1 10*3/uL (ref 0.0–0.1)
Basophils Relative: 1 % (ref 0–1)
Eosinophils Absolute: 0 10*3/uL (ref 0.0–0.7)
Eosinophils Relative: 0 % (ref 0–5)
HEMATOCRIT: 40.6 % (ref 36.0–46.0)
HEMOGLOBIN: 12.9 g/dL (ref 12.0–15.0)
LYMPHS ABS: 3.3 10*3/uL (ref 0.7–4.0)
LYMPHS PCT: 42 % (ref 12–46)
MCH: 25.1 pg — AB (ref 26.0–34.0)
MCHC: 31.8 g/dL (ref 30.0–36.0)
MCV: 79.1 fL (ref 78.0–100.0)
MONOS PCT: 6 % (ref 3–12)
MPV: 11 fL (ref 8.6–12.4)
Monocytes Absolute: 0.5 10*3/uL (ref 0.1–1.0)
NEUTROS ABS: 4 10*3/uL (ref 1.7–7.7)
NEUTROS PCT: 51 % (ref 43–77)
Platelets: 86 10*3/uL — ABNORMAL LOW (ref 150–400)
RBC: 5.13 MIL/uL — ABNORMAL HIGH (ref 3.87–5.11)
RDW: 14.8 % (ref 11.5–15.5)
WBC: 7.9 10*3/uL (ref 4.0–10.5)

## 2015-04-18 LAB — COMPREHENSIVE METABOLIC PANEL
ALBUMIN: 4.2 g/dL (ref 3.6–5.1)
ALT: 13 U/L (ref 6–29)
AST: 20 U/L (ref 10–35)
Alkaline Phosphatase: 105 U/L (ref 33–130)
BUN: 13 mg/dL (ref 7–25)
CALCIUM: 9.7 mg/dL (ref 8.6–10.4)
CHLORIDE: 101 mmol/L (ref 98–110)
CO2: 29 mmol/L (ref 20–31)
CREATININE: 0.83 mg/dL (ref 0.50–0.99)
Glucose, Bld: 99 mg/dL (ref 65–99)
POTASSIUM: 4.6 mmol/L (ref 3.5–5.3)
SODIUM: 139 mmol/L (ref 135–146)
TOTAL PROTEIN: 7.2 g/dL (ref 6.1–8.1)
Total Bilirubin: 0.4 mg/dL (ref 0.2–1.2)

## 2015-04-18 LAB — LIPID PANEL
CHOLESTEROL: 206 mg/dL — AB (ref 125–200)
HDL: 75 mg/dL (ref >=46)
LDL Cholesterol: 121 mg/dL (ref <130)
TRIGLYCERIDES: 52 mg/dL (ref <150)
Total CHOL/HDL Ratio: 2.7 Ratio (ref <=5.0)
VLDL: 10 mg/dL (ref <30)

## 2015-04-18 LAB — URINALYSIS W MICROSCOPIC + REFLEX CULTURE
BILIRUBIN URINE: NEGATIVE
Bacteria, UA: NONE SEEN [HPF]
CRYSTALS: NONE SEEN [HPF]
Casts: NONE SEEN [LPF]
Glucose, UA: NEGATIVE
Hgb urine dipstick: NEGATIVE
Ketones, ur: NEGATIVE
NITRITE: NEGATIVE
Protein, ur: NEGATIVE
SPECIFIC GRAVITY, URINE: 1.013 (ref 1.001–1.035)
YEAST: NONE SEEN [HPF]
pH: 7 (ref 5.0–8.0)

## 2015-04-18 LAB — VITAMIN D 25 HYDROXY (VIT D DEFICIENCY, FRACTURES): Vit D, 25-Hydroxy: 34 ng/mL (ref 30–100)

## 2015-04-21 LAB — CYTOLOGY - PAP

## 2015-04-21 LAB — URINE CULTURE

## 2015-04-25 ENCOUNTER — Other Ambulatory Visit: Payer: Self-pay | Admitting: Gynecology

## 2015-04-25 MED ORDER — AMPICILLIN 500 MG PO CAPS: 500.0000 mg | ORAL_CAPSULE | Freq: Four times a day (QID) | ORAL | Status: DC

## 2016-03-05 ENCOUNTER — Encounter: Payer: Self-pay | Admitting: Gynecology

## 2016-03-11 ENCOUNTER — Encounter: Payer: Self-pay | Admitting: Gynecology

## 2016-04-08 ENCOUNTER — Encounter: Payer: Self-pay | Admitting: Gynecology

## 2016-04-22 ENCOUNTER — Encounter: Payer: Managed Care, Other (non HMO) | Admitting: Gynecology

## 2016-05-27 ENCOUNTER — Encounter: Payer: Managed Care, Other (non HMO) | Admitting: Gynecology

## 2016-06-09 ENCOUNTER — Ambulatory Visit (INDEPENDENT_AMBULATORY_CARE_PROVIDER_SITE_OTHER): Payer: Managed Care, Other (non HMO) | Admitting: Gynecology

## 2016-06-09 ENCOUNTER — Encounter: Payer: Self-pay | Admitting: Gynecology

## 2016-06-09 VITALS — BP 130/78 | Ht 66.0 in | Wt 157.0 lb

## 2016-06-09 DIAGNOSIS — Z1329 Encounter for screening for other suspected endocrine disorder: Secondary | ICD-10-CM | POA: Diagnosis not present

## 2016-06-09 DIAGNOSIS — Z1321 Encounter for screening for nutritional disorder: Secondary | ICD-10-CM

## 2016-06-09 DIAGNOSIS — Z01411 Encounter for gynecological examination (general) (routine) with abnormal findings: Secondary | ICD-10-CM

## 2016-06-09 DIAGNOSIS — Z13220 Encounter for screening for lipoid disorders: Secondary | ICD-10-CM

## 2016-06-09 DIAGNOSIS — N952 Postmenopausal atrophic vaginitis: Secondary | ICD-10-CM

## 2016-06-09 LAB — CBC WITH DIFFERENTIAL/PLATELET
BASOS ABS: 74 {cells}/uL (ref 0–200)
Basophils Relative: 1 %
EOS ABS: 74 {cells}/uL (ref 15–500)
Eosinophils Relative: 1 %
HEMATOCRIT: 39.5 % (ref 35.0–45.0)
HEMOGLOBIN: 12.7 g/dL (ref 11.7–15.5)
LYMPHS PCT: 48 %
Lymphs Abs: 3552 cells/uL (ref 850–3900)
MCH: 25.8 pg — AB (ref 27.0–33.0)
MCHC: 32.2 g/dL (ref 32.0–36.0)
MCV: 80.1 fL (ref 80.0–100.0)
MONO ABS: 518 {cells}/uL (ref 200–950)
MPV: 11 fL (ref 7.5–12.5)
Monocytes Relative: 7 %
NEUTROS PCT: 43 %
Neutro Abs: 3182 cells/uL (ref 1500–7800)
Platelets: 91 10*3/uL — ABNORMAL LOW (ref 140–400)
RBC: 4.93 MIL/uL (ref 3.80–5.10)
RDW: 14.5 % (ref 11.0–15.0)
WBC: 7.4 10*3/uL (ref 3.8–10.8)

## 2016-06-09 LAB — TSH: TSH: 1.37 m[IU]/L

## 2016-06-09 NOTE — Patient Instructions (Signed)

## 2016-06-09 NOTE — Progress Notes (Signed)
    Cynthia Nunez Mar 20, 1951 [id number]        66 y.o.  G0P0 for annual exam.    Past medical history,surgical history, problem list, medications, allergies, family history and social history were all reviewed and documented as reviewed in the EPIC chart.  ROS:  Performed with pertinent positives and negatives included in the history, assessment and plan.   Additional significant findings :  None   Exam: Cynthia Nunez assistant Vitals:   06/09/16 1558  BP: 130/78  Weight: 157 lb (71.2 kg)  Height: 5\' 6"  (1.676 m)   Body mass index is 25.34 kg/m.  General appearance:  Normal affect, orientation and appearance. Skin: Grossly normal HEENT: Without gross lesions.  No cervical or supraclavicular adenopathy. Thyroid normal.  Lungs:  Clear without wheezing, rales or rhonchi Cardiac: RR, without RMG Abdominal:  Soft, nontender, without masses, guarding, rebound, organomegaly or hernia Breasts:  Examined lying and sitting without masses, retractions, discharge or axillary adenopathy. Pelvic:  Ext, BUS, Vagina with atrophic changes  Adnexa without masses or tenderness    Anus and perineum normal   Rectovaginal normal sphincter tone without palpated masses or tenderness.    Assessment/Plan:  66 y.o. G0P0 female for annual exam.   1. Status post TVH and separate BSO for leiomyoma and right adnexal mass which standout to be a stromal carcinoid 4.8 cm confined to the ovary. Follow up CT scan was negative. Consultation with gynecologic oncologist recommended no special follow up with negative cell washings, normal CA-125, pathology limited to the ovary and a negative follow up CT scan. Overall doing well without significant hot flushes, night sweats or vaginal dryness. 2. Pap smear 2016. Pap smear/HPV 2014. Pap smear of vaginal cuff done today. Does have history of anal intraepithelial neoplasia grade 1 2013. If returns negative then plan less frequent screening. 3. Colonoscopy 2017. Repeat  at their recommended interval. 4. DEXA 2015 normal. Repeat at 5 year interval. 5. Mammography 03/2016. Continue with annual mammography when due. SBE monthly reviewed. 6. Health maintenance. Baseline CBC, CMP, lipid profile, TSH, vitamin D, urinalysis ordered. Follow up 1 year, sooner as needed.   Cynthia Auerbach MD, 4:43 PM 06/09/2016

## 2016-06-09 NOTE — Addendum Note (Signed)
Addended by: Nelva Nay on: 06/09/2016 04:49 PM   Modules accepted: Orders

## 2016-06-10 LAB — LIPID PANEL
CHOL/HDL RATIO: 2.5 ratio (ref <5.0)
Cholesterol: 186 mg/dL (ref <200)
HDL: 74 mg/dL (ref >50)
LDL CALC: 101 mg/dL — AB (ref <100)
Triglycerides: 57 mg/dL (ref <150)
VLDL: 11 mg/dL (ref <30)

## 2016-06-10 LAB — URINALYSIS W MICROSCOPIC + REFLEX CULTURE
BILIRUBIN URINE: NEGATIVE
Casts: NONE SEEN [LPF]
GLUCOSE, UA: NEGATIVE
Ketones, ur: NEGATIVE
Nitrite: NEGATIVE
PROTEIN: NEGATIVE
Specific Gravity, Urine: 1.022 (ref 1.001–1.035)
Yeast: NONE SEEN [HPF]
pH: 6 (ref 5.0–8.0)

## 2016-06-10 LAB — COMPREHENSIVE METABOLIC PANEL
ALT: 15 U/L (ref 6–29)
AST: 20 U/L (ref 10–35)
Albumin: 4.2 g/dL (ref 3.6–5.1)
Alkaline Phosphatase: 104 U/L (ref 33–130)
BUN: 11 mg/dL (ref 7–25)
CALCIUM: 9.1 mg/dL (ref 8.6–10.4)
CHLORIDE: 105 mmol/L (ref 98–110)
CO2: 23 mmol/L (ref 20–31)
Creat: 0.83 mg/dL (ref 0.50–0.99)
GLUCOSE: 106 mg/dL — AB (ref 65–99)
POTASSIUM: 5 mmol/L (ref 3.5–5.3)
Sodium: 141 mmol/L (ref 135–146)
Total Bilirubin: 0.3 mg/dL (ref 0.2–1.2)
Total Protein: 6.7 g/dL (ref 6.1–8.1)

## 2016-06-10 LAB — PAP IG W/ RFLX HPV ASCU

## 2016-06-10 LAB — VITAMIN D 25 HYDROXY (VIT D DEFICIENCY, FRACTURES): Vit D, 25-Hydroxy: 26 ng/mL — ABNORMAL LOW (ref 30–100)

## 2016-06-11 ENCOUNTER — Other Ambulatory Visit: Payer: Self-pay | Admitting: Gynecology

## 2016-06-11 DIAGNOSIS — Z113 Encounter for screening for infections with a predominantly sexual mode of transmission: Secondary | ICD-10-CM

## 2016-06-11 MED ORDER — METRONIDAZOLE 500 MG PO TABS: 500.0000 mg | ORAL_TABLET | Freq: Two times a day (BID) | ORAL | 0 refills | Status: DC

## 2016-06-14 LAB — URINE CULTURE

## 2016-06-16 ENCOUNTER — Other Ambulatory Visit: Payer: Self-pay | Admitting: Gynecology

## 2016-06-16 DIAGNOSIS — Z113 Encounter for screening for infections with a predominantly sexual mode of transmission: Secondary | ICD-10-CM

## 2016-06-16 MED ORDER — CIPROFLOXACIN HCL 250 MG PO TABS: 250.0000 mg | ORAL_TABLET | Freq: Two times a day (BID) | ORAL | 0 refills | Status: DC

## 2016-06-25 ENCOUNTER — Telehealth: Payer: Self-pay

## 2016-06-25 NOTE — Telephone Encounter (Signed)
Patient called in voice mail and had some confusion regarding recent meds prescribed for her. She said that she took the Rx that was bid x 3 days. That would have been Cipro. But she said the pharmacy keeps calling her saying that they have Rx for her and she needs to know if there are more. She asked me to leave her a detailed message in voice mail.  I called her back and explained the one for 3 days was for UTI.  She still needs to treat the vaginal infection that we discussed that was diagnosed on her pap smear. I reminded her husband needs to see is MD and be treated as well.  I told her to go get the Rx that pharmacy is calling for.

## 2016-06-28 ENCOUNTER — Other Ambulatory Visit: Payer: Self-pay | Admitting: *Deleted

## 2016-06-28 ENCOUNTER — Other Ambulatory Visit: Payer: Managed Care, Other (non HMO)

## 2016-06-28 DIAGNOSIS — Z113 Encounter for screening for infections with a predominantly sexual mode of transmission: Secondary | ICD-10-CM

## 2016-06-29 LAB — GC/CHLAMYDIA PROBE AMP
CT PROBE, AMP APTIMA: NOT DETECTED
GC Probe RNA: NOT DETECTED

## 2017-06-16 ENCOUNTER — Ambulatory Visit (INDEPENDENT_AMBULATORY_CARE_PROVIDER_SITE_OTHER): Payer: 59 | Admitting: Gynecology

## 2017-06-16 ENCOUNTER — Encounter: Payer: Self-pay | Admitting: Gynecology

## 2017-06-16 VITALS — BP 120/74 | Ht 65.0 in | Wt 157.0 lb

## 2017-06-16 DIAGNOSIS — Z01411 Encounter for gynecological examination (general) (routine) with abnormal findings: Secondary | ICD-10-CM

## 2017-06-16 DIAGNOSIS — Z1321 Encounter for screening for nutritional disorder: Secondary | ICD-10-CM | POA: Diagnosis not present

## 2017-06-16 DIAGNOSIS — Z13220 Encounter for screening for lipoid disorders: Secondary | ICD-10-CM

## 2017-06-16 DIAGNOSIS — Z1329 Encounter for screening for other suspected endocrine disorder: Secondary | ICD-10-CM | POA: Diagnosis not present

## 2017-06-16 DIAGNOSIS — N952 Postmenopausal atrophic vaginitis: Secondary | ICD-10-CM

## 2017-06-16 NOTE — Patient Instructions (Signed)
Follow-up in 1 year for annual exam, sooner if any issues. 

## 2017-06-16 NOTE — Progress Notes (Signed)
    Cynthia Nunez [id number]        67 y.o.  G0P0 for annual gynecologic exam.  Doing well without gynecologic complaints  Past medical history,surgical history, problem list, medications, allergies, family history and social history were all reviewed and documented as reviewed in the EPIC chart.  ROS:  Performed with pertinent positives and negatives included in the history, assessment and plan.   Additional significant findings : None   Exam: [name] assistant Vitals:   06/16/17 1457  BP: 120/74  Weight: 157 lb (71.2 kg)  Height: 5\' 5"  (1.651 m)   Body mass index is 26.13 kg/m.  General appearance:  Normal affect, orientation and appearance. Skin: Grossly normal HEENT: Without gross lesions.  No cervical or supraclavicular adenopathy. Thyroid normal.  Lungs:  Clear without wheezing, rales or rhonchi Cardiac: RR, without RMG Abdominal:  Soft, nontender, without masses, guarding, rebound, organomegaly or hernia Breasts:  Examined lying and sitting without masses, retractions, discharge or axillary adenopathy. Pelvic:  Ext, BUS, Vagina: With atrophic changes  Adnexa: Without masses or tenderness    Anus and perineum: Normal   Rectovaginal: Normal sphincter tone without palpated masses or tenderness.    Assessment/Plan:  67 y.o. G0P0 female for annual gynecologic exam.  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  Follow-up CT scan was negative.  Gynecologic oncologist consult recommended no special follow-up with negative cell washings and normal CA 125.   1. Postmenopausal.  No significant hot flushes, night sweats or vaginal dryness. 2. Mammography due now and patient knows to call and schedule and agrees to do so.  Breast exam normal today. 3. DEXA 2015 normal.  Plan repeat at 5-year interval. 4. Pap smear 2018.  No Pap smear done today.  Patient does have a  history of anal intraepithelial neoplasia grade 1 in 2013.  Pap smear/HPV 2014 was negative.  We will plan repeat Pap smear at 3-year interval. 5. Colonoscopy 2017.  Repeat at their recommended interval. 6. Health maintenance.  Patient requests baseline labs.  CBC, CMP, lipid profile, TSH, vitamin D and urine analysis ordered.  Follow-up 1 year, sooner as needed.   [name] MD, 3:21 PM 06/16/2017

## 2017-06-17 LAB — LIPID PANEL
CHOLESTEROL: 202 mg/dL — AB (ref <200)
HDL: 70 mg/dL (ref >50)
LDL CHOLESTEROL (CALC): 117 mg/dL — AB
Non-HDL Cholesterol (Calc): 132 mg/dL (calc) — ABNORMAL HIGH (ref <130)
TRIGLYCERIDES: 56 mg/dL (ref <150)
Total CHOL/HDL Ratio: 2.9 (calc) (ref <5.0)

## 2017-06-17 LAB — COMPREHENSIVE METABOLIC PANEL
AG Ratio: 1.7 (calc) (ref 1.0–2.5)
ALBUMIN MSPROF: 4.2 g/dL (ref 3.6–5.1)
ALKALINE PHOSPHATASE (APISO): 107 U/L (ref 33–130)
ALT: 10 U/L (ref 6–29)
AST: 15 U/L (ref 10–35)
BUN: 9 mg/dL (ref 7–25)
CO2: 28 mmol/L (ref 20–32)
Calcium: 9.4 mg/dL (ref 8.6–10.4)
Chloride: 105 mmol/L (ref 98–110)
Creat: 0.93 mg/dL (ref 0.50–0.99)
Globulin: 2.5 g/dL (calc) (ref 1.9–3.7)
Glucose, Bld: 104 mg/dL — ABNORMAL HIGH (ref 65–99)
POTASSIUM: 4.8 mmol/L (ref 3.5–5.3)
Sodium: 140 mmol/L (ref 135–146)
TOTAL PROTEIN: 6.7 g/dL (ref 6.1–8.1)
Total Bilirubin: 0.3 mg/dL (ref 0.2–1.2)

## 2017-06-17 LAB — VITAMIN D 25 HYDROXY (VIT D DEFICIENCY, FRACTURES): VIT D 25 HYDROXY: 28 ng/mL — AB (ref 30–100)

## 2017-06-17 LAB — CBC WITH DIFFERENTIAL/PLATELET
BASOS PCT: 1.4 %
Basophils Absolute: 69 cells/uL (ref 0–200)
Eosinophils Absolute: 123 cells/uL (ref 15–500)
Eosinophils Relative: 2.5 %
HCT: 39.8 % (ref 35.0–45.0)
Hemoglobin: 12.8 g/dL (ref 11.7–15.5)
Lymphs Abs: 2171 cells/uL (ref 850–3900)
MCH: 25.4 pg — ABNORMAL LOW (ref 27.0–33.0)
MCHC: 32.2 g/dL (ref 32.0–36.0)
MCV: 79 fL — AB (ref 80.0–100.0)
MONOS PCT: 12.3 %
MPV: 11.1 fL (ref 7.5–12.5)
Neutro Abs: 1936 cells/uL (ref 1500–7800)
Neutrophils Relative %: 39.5 %
PLATELETS: 66 10*3/uL — AB (ref 140–400)
RBC: 5.04 10*6/uL (ref 3.80–5.10)
RDW: 13.3 % (ref 11.0–15.0)
TOTAL LYMPHOCYTE: 44.3 %
WBC mixed population: 603 cells/uL (ref 200–950)
WBC: 4.9 10*3/uL (ref 3.8–10.8)

## 2017-06-17 LAB — TSH: TSH: 1.25 mIU/L (ref 0.40–4.50)

## 2017-06-20 ENCOUNTER — Other Ambulatory Visit: Payer: Self-pay | Admitting: Gynecology

## 2017-06-20 DIAGNOSIS — E559 Vitamin D deficiency, unspecified: Secondary | ICD-10-CM

## 2017-06-20 DIAGNOSIS — R7989 Other specified abnormal findings of blood chemistry: Secondary | ICD-10-CM

## 2017-06-20 DIAGNOSIS — E7800 Pure hypercholesterolemia, unspecified: Secondary | ICD-10-CM

## 2017-06-20 DIAGNOSIS — R7309 Other abnormal glucose: Secondary | ICD-10-CM

## 2017-06-20 LAB — URINALYSIS, COMPLETE W/RFL CULTURE
Bacteria, UA: NONE SEEN /HPF
Bilirubin Urine: NEGATIVE
Glucose, UA: NEGATIVE
HYALINE CAST: NONE SEEN /LPF
Hgb urine dipstick: NEGATIVE
Ketones, ur: NEGATIVE
Nitrites, Initial: NEGATIVE
PROTEIN: NEGATIVE
RBC / HPF: NONE SEEN /HPF (ref 0–2)
SPECIFIC GRAVITY, URINE: 1.007 (ref 1.001–1.03)
Squamous Epithelial / LPF: NONE SEEN /HPF (ref <=5)
pH: 6.5 (ref 5.0–8.0)

## 2017-06-20 LAB — URINE CULTURE
MICRO NUMBER:: 90166727
RESULT: NO GROWTH
SPECIMEN QUALITY:: ADEQUATE

## 2017-06-20 LAB — CULTURE INDICATED

## 2017-06-20 MED ORDER — VITAMIN D (ERGOCALCIFEROL) 1.25 MG (50000 UNIT) PO CAPS: 50000.0000 [IU] | ORAL_CAPSULE | ORAL | 0 refills | Status: DC

## 2017-07-27 ENCOUNTER — Other Ambulatory Visit: Payer: 59

## 2017-07-27 DIAGNOSIS — R7309 Other abnormal glucose: Secondary | ICD-10-CM

## 2017-07-27 DIAGNOSIS — R7989 Other specified abnormal findings of blood chemistry: Secondary | ICD-10-CM

## 2017-07-27 DIAGNOSIS — E7800 Pure hypercholesterolemia, unspecified: Secondary | ICD-10-CM

## 2017-07-27 DIAGNOSIS — E559 Vitamin D deficiency, unspecified: Secondary | ICD-10-CM

## 2017-07-28 LAB — CBC WITH DIFFERENTIAL/PLATELET
Basophils Absolute: 70 cells/uL (ref 0–200)
Basophils Relative: 1 %
Eosinophils Absolute: 70 cells/uL (ref 15–500)
Eosinophils Relative: 1 %
HEMATOCRIT: 39.8 % (ref 35.0–45.0)
HEMOGLOBIN: 13 g/dL (ref 11.7–15.5)
LYMPHS ABS: 2905 {cells}/uL (ref 850–3900)
MCH: 25.9 pg — ABNORMAL LOW (ref 27.0–33.0)
MCHC: 32.7 g/dL (ref 32.0–36.0)
MCV: 79.4 fL — ABNORMAL LOW (ref 80.0–100.0)
MPV: 11.8 fL (ref 7.5–12.5)
Monocytes Relative: 6.9 %
NEUTROS PCT: 49.6 %
Neutro Abs: 3472 cells/uL (ref 1500–7800)
Platelets: 94 10*3/uL — ABNORMAL LOW (ref 140–400)
RBC: 5.01 10*6/uL (ref 3.80–5.10)
RDW: 13.4 % (ref 11.0–15.0)
Total Lymphocyte: 41.5 %
WBC: 7 10*3/uL (ref 3.8–10.8)
WBCMIX: 483 {cells}/uL (ref 200–950)

## 2017-07-28 LAB — VITAMIN D 25 HYDROXY (VIT D DEFICIENCY, FRACTURES): Vit D, 25-Hydroxy: 45 ng/mL (ref 30–100)

## 2017-07-28 LAB — GLUCOSE, RANDOM: Glucose, Bld: 79 mg/dL (ref 65–99)

## 2017-07-28 LAB — LIPID PANEL
Cholesterol: 213 mg/dL — ABNORMAL HIGH (ref <200)
HDL: 65 mg/dL (ref >50)
LDL Cholesterol (Calc): 134 mg/dL (calc) — ABNORMAL HIGH
NON-HDL CHOLESTEROL (CALC): 148 mg/dL — AB (ref <130)
Total CHOL/HDL Ratio: 3.3 (calc) (ref <5.0)
Triglycerides: 53 mg/dL (ref <150)

## 2017-07-29 ENCOUNTER — Telehealth: Payer: Self-pay

## 2017-07-29 NOTE — Telephone Encounter (Signed)
-----   Message from [name], MD sent at 07/28/2017  7:52 AM EDT ----- Outpatient: 1.  Vitamin D level better at 45.  Stay on the extra vitamin D she is taking. 2.  Platelet count 94,000 which is better than the 66 it was previously. 3.  Cholesterol and LDL remain borderline elevated.  I would recommend obtaining copies from MyChart in the next time she sees her primary physician to follow-up with them in reference to this.

## 2017-07-29 NOTE — Telephone Encounter (Signed)
Left detailed message in voice mail per DPR access note on file. 

## 2017-07-29 NOTE — Telephone Encounter (Signed)
I would complete her vitamin D course and then go on a maintenance of 2000 units daily.  She does not need to return for repeat of vitamin D.  We will check it at her next annual exam.

## 2017-07-29 NOTE — Telephone Encounter (Signed)
Patient was informed about results.  Patient was started on prescription strength Vitamin D on Feb 7 and was to return in 12 weeks for VIT D level recheck. However, they drew it when she returned for CBC and FLP even though I had note on it not to draw it at that time.  Since, it is normal now I told her no need to return again at end of Vit D 50,000 Rx but to finish the weekly Rx.  I told her I would check with you what you want her to take after she completes Rx.

## 2017-09-15 ENCOUNTER — Other Ambulatory Visit: Payer: Self-pay | Admitting: Gynecology

## 2017-09-26 ENCOUNTER — Telehealth: Payer: Self-pay | Admitting: *Deleted

## 2017-09-26 NOTE — Telephone Encounter (Signed)
Patient called and left message in triage voicemail requesting copy of results to take to PCP for appointment this week. I tried to call patient back and relay she will need to fill out medical release form, but her voicemail was full, not able to leave a message.

## 2017-12-02 DIAGNOSIS — H524 Presbyopia: Secondary | ICD-10-CM | POA: Diagnosis not present

## 2017-12-07 DIAGNOSIS — Z0100 Encounter for examination of eyes and vision without abnormal findings: Secondary | ICD-10-CM | POA: Diagnosis not present

## 2018-05-09 DIAGNOSIS — Z1231 Encounter for screening mammogram for malignant neoplasm of breast: Secondary | ICD-10-CM | POA: Diagnosis not present

## 2018-06-22 ENCOUNTER — Ambulatory Visit (INDEPENDENT_AMBULATORY_CARE_PROVIDER_SITE_OTHER): Payer: 59 | Admitting: Gynecology

## 2018-06-22 ENCOUNTER — Encounter: Payer: Self-pay | Admitting: Gynecology

## 2018-06-22 VITALS — BP 122/80 | Ht 64.0 in | Wt 158.0 lb

## 2018-06-22 DIAGNOSIS — Z1321 Encounter for screening for nutritional disorder: Secondary | ICD-10-CM

## 2018-06-22 DIAGNOSIS — N952 Postmenopausal atrophic vaginitis: Secondary | ICD-10-CM

## 2018-06-22 DIAGNOSIS — Z01419 Encounter for gynecological examination (general) (routine) without abnormal findings: Secondary | ICD-10-CM | POA: Diagnosis not present

## 2018-06-22 DIAGNOSIS — Z13220 Encounter for screening for lipoid disorders: Secondary | ICD-10-CM

## 2018-06-22 DIAGNOSIS — Z1329 Encounter for screening for other suspected endocrine disorder: Secondary | ICD-10-CM | POA: Diagnosis not present

## 2018-06-22 NOTE — Progress Notes (Addendum)
    Cynthia Nunez 1950/07/15 [id number]        68 y.o.  G0P0000 for annual gynecologic exam.  Doing well without gynecologic complaints  Past medical history,surgical history, problem list, medications, allergies, family history and social history were all reviewed and documented as reviewed in the EPIC chart.  ROS:  Performed with pertinent positives and negatives included in the history, assessment and plan.   Additional significant findings : None   Exam: [name] assistant Vitals:   06/22/18 1510  BP: 122/80  Weight: 158 lb (71.7 kg)  Height: 5\' 4"  (1.626 m)   Body mass index is 27.12 kg/m.  General appearance:  Normal affect, orientation and appearance. Skin: Grossly normal HEENT: Without gross lesions.  No cervical or supraclavicular adenopathy. Thyroid normal.  Lungs:  Clear without wheezing, rales or rhonchi Cardiac: RR, without RMG Abdominal:  Soft, nontender, without masses, guarding, rebound, organomegaly or hernia Breasts:  Examined lying and sitting without masses, retractions, discharge or axillary adenopathy. Pelvic:  Ext, BUS, Vagina: With atrophic changes.  Pap smear of vaginal cuff done  Adnexa: Without masses or tenderness    Anus and perineum: Normal   Rectovaginal: Normal sphincter tone without palpated masses or tenderness.    Assessment/Plan:  68 y.o. G0P0000 female for annual gynecologic exam.   1. Postmenopausal.  Status post TVH.  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  Follow-up CT scan was negative.  Gynecologic oncology consult recommended no special follow-up.  Patient doing well without menopausal symptoms. 2. DEXA 2015 normal.  Recommend follow-up DEXA now at 5-year interval as she is turned 67 and she will schedule. 3. Pap smear 2018.  Pap smear of vaginal cuff done today.  History of anal intraepithelial neoplasia grade 1 in 2013.  Pap smear HPV 2014 was negative and follow-up Pap smear 2018 was  negative. 4. Mammography 04/2018.  Continue with annual mammography when due.  Breast exam normal today. 5. Colonoscopy 2017.  Repeat at their recommended interval. 6. Health maintenance.  Requests that we do lab work.  States that her primary physician does not do routine lab work.  CBC, CMP, lipid profile, vitamin D and TSH ordered.  Follow-up 1 year, sooner as needed.   [name] MD, 3:30 PM 06/22/2018

## 2018-06-22 NOTE — Addendum Note (Signed)
Addended by: [name] on: 06/22/2018 03:36 PM   Modules accepted: Orders

## 2018-06-22 NOTE — Addendum Note (Signed)
Addended by: Nelva Nay on: 06/22/2018 04:02 PM   Modules accepted: Orders

## 2018-06-22 NOTE — Patient Instructions (Signed)
Follow-up for the bone density as scheduled. 

## 2018-06-23 LAB — PAP IG W/ RFLX HPV ASCU

## 2018-06-23 LAB — COMPREHENSIVE METABOLIC PANEL
AG Ratio: 1.6 (calc) (ref 1.0–2.5)
ALBUMIN MSPROF: 4.5 g/dL (ref 3.6–5.1)
ALT: 13 U/L (ref 6–29)
AST: 17 U/L (ref 10–35)
Alkaline phosphatase (APISO): 108 U/L (ref 37–153)
BUN: 13 mg/dL (ref 7–25)
CO2: 30 mmol/L (ref 20–32)
CREATININE: 0.98 mg/dL (ref 0.50–0.99)
Calcium: 9.8 mg/dL (ref 8.6–10.4)
Chloride: 102 mmol/L (ref 98–110)
GLUCOSE: 86 mg/dL (ref 65–99)
Globulin: 2.8 g/dL (calc) (ref 1.9–3.7)
Potassium: 4.1 mmol/L (ref 3.5–5.3)
SODIUM: 139 mmol/L (ref 135–146)
TOTAL PROTEIN: 7.3 g/dL (ref 6.1–8.1)
Total Bilirubin: 0.5 mg/dL (ref 0.2–1.2)

## 2018-06-23 LAB — CBC WITH DIFFERENTIAL/PLATELET
Absolute Monocytes: 398 cells/uL (ref 200–950)
BASOS ABS: 50 {cells}/uL (ref 0–200)
BASOS PCT: 0.6 %
EOS ABS: 50 {cells}/uL (ref 15–500)
EOS PCT: 0.6 %
HCT: 42.2 % (ref 35.0–45.0)
HEMOGLOBIN: 13.5 g/dL (ref 11.7–15.5)
LYMPHS ABS: 3519 {cells}/uL (ref 850–3900)
MCH: 25.8 pg — AB (ref 27.0–33.0)
MCHC: 32 g/dL (ref 32.0–36.0)
MCV: 80.5 fL (ref 80.0–100.0)
MPV: 10.9 fL (ref 7.5–12.5)
Monocytes Relative: 4.8 %
NEUTROS ABS: 4283 {cells}/uL (ref 1500–7800)
Neutrophils Relative %: 51.6 %
Platelets: 86 10*3/uL — ABNORMAL LOW (ref 140–400)
RBC: 5.24 10*6/uL — ABNORMAL HIGH (ref 3.80–5.10)
RDW: 13.6 % (ref 11.0–15.0)
Total Lymphocyte: 42.4 %
WBC: 8.3 10*3/uL (ref 3.8–10.8)

## 2018-06-23 LAB — VITAMIN D 25 HYDROXY (VIT D DEFICIENCY, FRACTURES): Vit D, 25-Hydroxy: 29 ng/mL — ABNORMAL LOW (ref 30–100)

## 2018-06-23 LAB — LIPID PANEL
CHOLESTEROL: 228 mg/dL — AB (ref <200)
HDL: 73 mg/dL (ref >=50)
LDL CHOLESTEROL (CALC): 140 mg/dL — AB
Non-HDL Cholesterol (Calc): 155 mg/dL (calc) — ABNORMAL HIGH (ref <130)
TRIGLYCERIDES: 59 mg/dL (ref <150)
Total CHOL/HDL Ratio: 3.1 (calc) (ref <5.0)

## 2018-06-23 LAB — TSH: TSH: 1.58 m[IU]/L (ref 0.40–4.50)

## 2018-06-28 ENCOUNTER — Other Ambulatory Visit: Payer: Self-pay | Admitting: Gynecology

## 2018-07-26 ENCOUNTER — Other Ambulatory Visit: Payer: Self-pay | Admitting: Gynecology

## 2018-07-26 ENCOUNTER — Other Ambulatory Visit: Payer: Self-pay

## 2018-07-26 ENCOUNTER — Ambulatory Visit (INDEPENDENT_AMBULATORY_CARE_PROVIDER_SITE_OTHER): Payer: 59

## 2018-07-26 DIAGNOSIS — Z01419 Encounter for gynecological examination (general) (routine) without abnormal findings: Secondary | ICD-10-CM

## 2018-07-26 DIAGNOSIS — Z780 Asymptomatic menopausal state: Secondary | ICD-10-CM

## 2018-07-26 DIAGNOSIS — Z13820 Encounter for screening for osteoporosis: Secondary | ICD-10-CM | POA: Diagnosis not present

## 2018-07-27 ENCOUNTER — Encounter: Payer: Self-pay | Admitting: Gynecology

## 2018-08-04 DIAGNOSIS — R05 Cough: Secondary | ICD-10-CM | POA: Diagnosis not present

## 2018-11-23 DIAGNOSIS — Z0000 Encounter for general adult medical examination without abnormal findings: Secondary | ICD-10-CM | POA: Diagnosis not present

## 2018-11-23 DIAGNOSIS — D696 Thrombocytopenia, unspecified: Secondary | ICD-10-CM | POA: Diagnosis not present

## 2018-11-23 DIAGNOSIS — Z13220 Encounter for screening for lipoid disorders: Secondary | ICD-10-CM | POA: Diagnosis not present

## 2018-11-23 DIAGNOSIS — R030 Elevated blood-pressure reading, without diagnosis of hypertension: Secondary | ICD-10-CM | POA: Diagnosis not present

## 2018-11-23 DIAGNOSIS — Z23 Encounter for immunization: Secondary | ICD-10-CM | POA: Diagnosis not present

## 2018-12-07 DIAGNOSIS — Z566 Other physical and mental strain related to work: Secondary | ICD-10-CM | POA: Diagnosis not present

## 2018-12-07 DIAGNOSIS — R030 Elevated blood-pressure reading, without diagnosis of hypertension: Secondary | ICD-10-CM | POA: Diagnosis not present

## 2019-01-18 DIAGNOSIS — R030 Elevated blood-pressure reading, without diagnosis of hypertension: Secondary | ICD-10-CM | POA: Diagnosis not present

## 2019-02-14 ENCOUNTER — Encounter: Payer: Self-pay | Admitting: Gynecology

## 2019-04-26 ENCOUNTER — Other Ambulatory Visit: Payer: Self-pay | Admitting: Cardiology

## 2019-04-26 DIAGNOSIS — Z20822 Contact with and (suspected) exposure to covid-19: Secondary | ICD-10-CM

## 2019-04-26 DIAGNOSIS — Z20828 Contact with and (suspected) exposure to other viral communicable diseases: Secondary | ICD-10-CM | POA: Diagnosis not present

## 2019-04-27 LAB — NOVEL CORONAVIRUS, NAA: SARS-CoV-2, NAA: NOT DETECTED

## 2019-05-17 DIAGNOSIS — Z1231 Encounter for screening mammogram for malignant neoplasm of breast: Secondary | ICD-10-CM | POA: Diagnosis not present

## 2019-06-27 ENCOUNTER — Other Ambulatory Visit: Payer: Self-pay

## 2019-06-28 ENCOUNTER — Encounter: Payer: 59 | Admitting: Obstetrics and Gynecology

## 2019-06-29 ENCOUNTER — Encounter: Payer: Self-pay | Admitting: Obstetrics and Gynecology

## 2019-06-29 ENCOUNTER — Ambulatory Visit (INDEPENDENT_AMBULATORY_CARE_PROVIDER_SITE_OTHER): Payer: 59 | Admitting: Obstetrics and Gynecology

## 2019-06-29 ENCOUNTER — Other Ambulatory Visit: Payer: Self-pay

## 2019-06-29 VITALS — BP 122/82 | Ht 66.0 in | Wt 163.0 lb

## 2019-06-29 DIAGNOSIS — R300 Dysuria: Secondary | ICD-10-CM

## 2019-06-29 DIAGNOSIS — Z13220 Encounter for screening for lipoid disorders: Secondary | ICD-10-CM

## 2019-06-29 DIAGNOSIS — Z01419 Encounter for gynecological examination (general) (routine) without abnormal findings: Secondary | ICD-10-CM | POA: Diagnosis not present

## 2019-06-29 NOTE — Progress Notes (Addendum)
[facility] 1950/08/31 [id number]  SUBJECTIVE:  69 y.o. G0P0000 female for annual routine gynecologic exam. She has no gynecologic concerns.  She has a 3 week history of a right low back pain that radiates along right flank to the front.  Was constant in the first week and then intermittent the following 2 weeks.  Felt it again last night.  Calls it a 'nagging' and 'sharp' pain.   Not affected by movement or eating.  Heating pad helped minimally.  No blood in stool or urine.  No pain with defecation or urination.  No changes to bowel habits.  Says she has a history of low back pain with history of a 'slipped disk.'  Not currently having the pain today.   Current Outpatient Medications  Medication Sig Dispense Refill  . Cholecalciferol (VITAMIN D PO) Take by mouth.     No current facility-administered medications for this visit.   Allergies: Sudafed [pseudoephedrine hcl], Fluconazole, Sulfamethizole, and Miralax [polyethylene glycol]  No LMP recorded. Patient has had a hysterectomy.  Past medical history,surgical history, problem list, medications, allergies, family history and social history were all reviewed and documented as reviewed in the EPIC chart.  ROS:  Feeling well. No dyspnea or chest pain on exertion.  +back/flank pain (right) - recent, none currently.  No abdominal pain, change in bowel habits, black or bloody stools.  No urinary tract symptoms. GYN ROS: no abnormal bleeding, pelvic pain or discharge, no breast pain or new or enlarging lumps on self exam. No neurological complaints.    OBJECTIVE:  There were no vitals taken for this visit. The patient appears well, alert, oriented x 3, in no distress. ENT normal.  Neck supple. No cervical or supraclavicular adenopathy or thyromegaly.  Lungs are clear, good air entry, no wheezes, rhonchi or rales. S1 and S2 normal, no murmurs, regular rate and rhythm.  Abdomen soft without tenderness, guarding, mass or organomegaly.   Neurological is normal, no focal findings. Back: no tenderness in right back/paraspinous muscles or flank, no visual abnormality, patient points to area of right SI joint as the site of her pain  BREAST EXAM: breasts appear normal, no suspicious masses, no skin or nipple changes or axillary nodes  PELVIC EXAM: VULVA: normal appearing vulva with no masses, tenderness or lesions, VAGINA: normal appearing vagina with normal color and discharge, no lesions, CERVIX: surgically absent, UTERUS: surgically absent, vaginal cuff well healed, ADNEXA: no masses, mild tenderness in RLQ, unable to reproduce the pain symptoms she has felt lately on exam. RECTAL: normal rectal, no masses  Urinalysis indicates 6-10 WBC, no RBC, 0-5 squamous epithelials, few bacteria, no yeast or casts, 1+ leukocyte esterase, negative nitrite.  Negative glucose and ketones.  Chaperone: [name] present during the examination  ASSESSMENT:  69 y.o. G0P0000 here for annual gynecologic exam  PLAN:   1.  Postmenopausal.  History of TVH.  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  Follow-up CT scan was negative.  GYN oncology did not recommend any further follow-up.  No menopausal concerns at this time. 2. Pap smear 2020.  Pap smear not repeated today.  History of anal intraepithelial neoplasia grade 1 in 2013.  Pap smear HPV 2014 was negative and follow-up Pap smear 2018 and 2020 were negative. 3. Mammogram 04/2018.  Normal breast exam today.  She is reminded to schedule an annual mammogram this year. 4. Back pain.  Uncertain etiology, but  given the description of the symptoms, location of the pain, and prior history of low back pain, I suspect a musculoskeletal issue.  Unable to reproduce the pain on exam today and she is not currently having the pain.  She was concerned about kidney and urinary tract infection, but her UA results do not strongly suggest this.  We will  send the UA for culture just to be sure.  No evidence of a ureteral or kidney stone based on lack of hematuria.  She has previously had all of her female internal pelvic organs removed, but she does have that history of a stromal carcinoid tumor that was confined to the ovary.  It is confirmed from the operative report from 2012 that she did have a BSO, so it would be highly unlikely that there is any regrowth of the tumor in the pelvic area that would be causing her pain, but that is something to keep in mind if there are no other defined causes.  I suggested that she keep an eye on the symptoms and if they are continue to recur, or if they are getting worse, she should let us and her primary care doctor know for further recommendations.  **Would like to be called about urine culture result instead of MyChart. 5. Colonoscopy 2017.  Recommended that she follow up at the recommended interval.   6. DEXA 07/2018 normal.  Next DEXA recommended in 2025. 7. Health maintenance.   She will proceed to lab today for routine screening blood work (lipids, CBC, CMP).  Return annually or sooner, prn.  Joseph Pierini MD  06/29/19

## 2019-06-29 NOTE — Patient Instructions (Addendum)
Keep an eye of your back discomfort, if it is still there in another few weeks, please let us know and we can look at the pelvic area with an ultrasound.  If that does not find anything, then I would recommend checking in with your primary doctor to see if there are other recommendations for work up. Please schedule a mammogram when able.

## 2019-07-01 ENCOUNTER — Ambulatory Visit: Payer: 59 | Attending: Internal Medicine

## 2019-07-01 DIAGNOSIS — Z23 Encounter for immunization: Secondary | ICD-10-CM | POA: Insufficient documentation

## 2019-07-01 LAB — URINALYSIS, COMPLETE W/RFL CULTURE
Bilirubin Urine: NEGATIVE
Glucose, UA: NEGATIVE
Hgb urine dipstick: NEGATIVE
Hyaline Cast: NONE SEEN /LPF
Ketones, ur: NEGATIVE
Nitrites, Initial: NEGATIVE
Protein, ur: NEGATIVE
RBC / HPF: NONE SEEN /HPF (ref 0–2)
Specific Gravity, Urine: 1.01 (ref 1.001–1.03)
pH: 6.5 (ref 5.0–8.0)

## 2019-07-01 LAB — URINE CULTURE
MICRO NUMBER:: 10167647
SPECIMEN QUALITY:: ADEQUATE

## 2019-07-01 LAB — CULTURE INDICATED

## 2019-07-01 NOTE — Progress Notes (Signed)
   Covid-19 Vaccination Clinic  Name:  Cynthia Nunez    MRN: [id number] DOB: 1950-07-02  07/01/2019  [name] was observed post Covid-19 immunization for 15 minutes without incidence. She was provided with Vaccine Information Sheet and instruction to access the V-Safe system.   [name] was instructed to call 911 with any severe reactions post vaccine: Marland Kitchen Difficulty breathing  . Swelling of your face and throat  . A fast heartbeat  . A bad rash all over your body  . Dizziness and weakness    Immunizations Administered    Name Date Dose VIS Date Route   Pfizer COVID-19 Vaccine 07/01/2019 12:13 PM 0.3 mL 04/20/2019 Intramuscular   Manufacturer: [place]   Lot: [id number]   [place]: [id number]

## 2019-07-25 ENCOUNTER — Ambulatory Visit: Payer: 59 | Attending: Internal Medicine

## 2019-07-25 DIAGNOSIS — Z23 Encounter for immunization: Secondary | ICD-10-CM

## 2019-07-25 NOTE — Progress Notes (Signed)
   Covid-19 Vaccination Clinic  Name:  Cynthia Nunez    MRN: [id number] DOB: 07-01-1950  07/25/2019  Cynthia Nunez was observed post Covid-19 immunization for 30 minutes based on pre-vaccination screening without incident. She was provided with Vaccine Information Sheet and instruction to access the V-Safe system.   Cynthia Nunez was instructed to call 911 with any severe reactions post vaccine: Marland Kitchen Difficulty breathing  . Swelling of face and throat  . A fast heartbeat  . A bad rash all over body  . Dizziness and weakness   Immunizations Administered    Name Date Dose VIS Date Route   Pfizer COVID-19 Vaccine 07/25/2019  9:12 AM 0.3 mL 04/20/2019 Intramuscular   Manufacturer: [place]   Lot: [id number]   [place]: [id number]

## 2019-09-19 ENCOUNTER — Ambulatory Visit
Admission: RE | Admit: 2019-09-19 | Discharge: 2019-09-19 | Disposition: A | Discharge status: Home / Self Care | Payer: 59 | Source: Ambulatory Visit | Attending: Internal Medicine | Admitting: Internal Medicine

## 2019-09-19 ENCOUNTER — Other Ambulatory Visit: Payer: Self-pay | Admitting: Internal Medicine

## 2019-09-19 DIAGNOSIS — M545 Low back pain: Secondary | ICD-10-CM | POA: Diagnosis not present

## 2019-09-19 DIAGNOSIS — M5441 Lumbago with sciatica, right side: Secondary | ICD-10-CM

## 2019-10-31 DIAGNOSIS — H40013 Open angle with borderline findings, low risk, bilateral: Secondary | ICD-10-CM | POA: Diagnosis not present

## 2019-11-26 DIAGNOSIS — Z13220 Encounter for screening for lipoid disorders: Secondary | ICD-10-CM | POA: Diagnosis not present

## 2019-11-26 DIAGNOSIS — Z0000 Encounter for general adult medical examination without abnormal findings: Secondary | ICD-10-CM | POA: Diagnosis not present

## 2019-11-26 DIAGNOSIS — D696 Thrombocytopenia, unspecified: Secondary | ICD-10-CM | POA: Diagnosis not present

## 2019-11-26 DIAGNOSIS — R030 Elevated blood-pressure reading, without diagnosis of hypertension: Secondary | ICD-10-CM | POA: Diagnosis not present

## 2019-12-24 DIAGNOSIS — E7800 Pure hypercholesterolemia, unspecified: Secondary | ICD-10-CM | POA: Diagnosis not present

## 2019-12-24 DIAGNOSIS — R030 Elevated blood-pressure reading, without diagnosis of hypertension: Secondary | ICD-10-CM | POA: Diagnosis not present

## 2020-03-24 ENCOUNTER — Observation Stay (HOSPITAL_COMMUNITY): Payer: No Typology Code available for payment source

## 2020-03-24 ENCOUNTER — Encounter (HOSPITAL_COMMUNITY): Payer: Self-pay | Admitting: Emergency Medicine

## 2020-03-24 ENCOUNTER — Observation Stay (HOSPITAL_COMMUNITY)
Admission: EM | Admit: 2020-03-24 | Discharge: 2020-03-26 | Disposition: A | Discharge status: Home / Self Care | Payer: No Typology Code available for payment source | Attending: Internal Medicine | Admitting: Internal Medicine

## 2020-03-24 ENCOUNTER — Emergency Department (HOSPITAL_COMMUNITY): Payer: No Typology Code available for payment source

## 2020-03-24 ENCOUNTER — Other Ambulatory Visit: Payer: Self-pay

## 2020-03-24 DIAGNOSIS — Z888 Allergy status to other drugs, medicaments and biological substances status: Secondary | ICD-10-CM | POA: Insufficient documentation

## 2020-03-24 DIAGNOSIS — I700 Atherosclerosis of aorta: Secondary | ICD-10-CM | POA: Insufficient documentation

## 2020-03-24 DIAGNOSIS — I160 Hypertensive urgency: Secondary | ICD-10-CM | POA: Diagnosis not present

## 2020-03-24 DIAGNOSIS — R001 Bradycardia, unspecified: Secondary | ICD-10-CM

## 2020-03-24 DIAGNOSIS — J90 Pleural effusion, not elsewhere classified: Secondary | ICD-10-CM | POA: Diagnosis not present

## 2020-03-24 DIAGNOSIS — R55 Syncope and collapse: Secondary | ICD-10-CM | POA: Diagnosis present

## 2020-03-24 DIAGNOSIS — D693 Immune thrombocytopenic purpura: Secondary | ICD-10-CM | POA: Diagnosis present

## 2020-03-24 DIAGNOSIS — Z8543 Personal history of malignant neoplasm of ovary: Secondary | ICD-10-CM | POA: Insufficient documentation

## 2020-03-24 DIAGNOSIS — I10 Essential (primary) hypertension: Secondary | ICD-10-CM | POA: Insufficient documentation

## 2020-03-24 DIAGNOSIS — K6282 Dysplasia of anus: Secondary | ICD-10-CM | POA: Diagnosis not present

## 2020-03-24 DIAGNOSIS — Z833 Family history of diabetes mellitus: Secondary | ICD-10-CM | POA: Insufficient documentation

## 2020-03-24 DIAGNOSIS — Z20822 Contact with and (suspected) exposure to covid-19: Secondary | ICD-10-CM | POA: Diagnosis not present

## 2020-03-24 DIAGNOSIS — R079 Chest pain, unspecified: Secondary | ICD-10-CM | POA: Diagnosis not present

## 2020-03-24 DIAGNOSIS — I440 Atrioventricular block, first degree: Secondary | ICD-10-CM | POA: Diagnosis not present

## 2020-03-24 DIAGNOSIS — Z882 Allergy status to sulfonamides status: Secondary | ICD-10-CM | POA: Insufficient documentation

## 2020-03-24 DIAGNOSIS — Z841 Family history of disorders of kidney and ureter: Secondary | ICD-10-CM | POA: Diagnosis not present

## 2020-03-24 DIAGNOSIS — E785 Hyperlipidemia, unspecified: Secondary | ICD-10-CM | POA: Diagnosis present

## 2020-03-24 DIAGNOSIS — I2510 Atherosclerotic heart disease of native coronary artery without angina pectoris: Secondary | ICD-10-CM | POA: Insufficient documentation

## 2020-03-24 DIAGNOSIS — Z85048 Personal history of other malignant neoplasm of rectum, rectosigmoid junction, and anus: Secondary | ICD-10-CM

## 2020-03-24 DIAGNOSIS — I444 Left anterior fascicular block: Secondary | ICD-10-CM | POA: Diagnosis present

## 2020-03-24 DIAGNOSIS — R42 Dizziness and giddiness: Secondary | ICD-10-CM | POA: Diagnosis present

## 2020-03-24 DIAGNOSIS — R0789 Other chest pain: Secondary | ICD-10-CM | POA: Diagnosis present

## 2020-03-24 DIAGNOSIS — Z79899 Other long term (current) drug therapy: Secondary | ICD-10-CM | POA: Insufficient documentation

## 2020-03-24 LAB — RAPID URINE DRUG SCREEN, HOSP PERFORMED
Amphetamines: NOT DETECTED
Barbiturates: NOT DETECTED
Benzodiazepines: NOT DETECTED
Cocaine: NOT DETECTED
Opiates: NOT DETECTED
Tetrahydrocannabinol: NOT DETECTED

## 2020-03-24 LAB — ECHOCARDIOGRAM COMPLETE
Area-P 1/2: 3.48 cm2
Height: 60 in
S' Lateral: 2.6 cm

## 2020-03-24 LAB — LIPID PANEL
Cholesterol: 216 mg/dL — ABNORMAL HIGH (ref 0–200)
HDL: 68 mg/dL (ref >40)
LDL Cholesterol: 141 mg/dL — ABNORMAL HIGH (ref 0–99)
Total CHOL/HDL Ratio: 3.2 RATIO
Triglycerides: 33 mg/dL (ref <150)
VLDL: 7 mg/dL (ref 0–40)

## 2020-03-24 LAB — CBG MONITORING, ED: Glucose-Capillary: 85 mg/dL (ref 70–99)

## 2020-03-24 LAB — URINALYSIS, ROUTINE W REFLEX MICROSCOPIC
Bacteria, UA: NONE SEEN
Bilirubin Urine: NEGATIVE
Glucose, UA: NEGATIVE mg/dL
Hgb urine dipstick: NEGATIVE
Ketones, ur: NEGATIVE mg/dL
Nitrite: NEGATIVE
Protein, ur: NEGATIVE mg/dL
Specific Gravity, Urine: 1.005 (ref 1.005–1.030)
pH: 7 (ref 5.0–8.0)

## 2020-03-24 LAB — BASIC METABOLIC PANEL
Anion gap: 9 (ref 5–15)
BUN: 18 mg/dL (ref 8–23)
CO2: 25 mmol/L (ref 22–32)
Calcium: 9.4 mg/dL (ref 8.9–10.3)
Chloride: 107 mmol/L (ref 98–111)
Creatinine, Ser: 0.99 mg/dL (ref 0.44–1.00)
GFR, Estimated: >60 mL/min (ref >60)
Glucose, Bld: 112 mg/dL — ABNORMAL HIGH (ref 70–99)
Potassium: 4.6 mmol/L (ref 3.5–5.1)
Sodium: 141 mmol/L (ref 135–145)

## 2020-03-24 LAB — TSH: TSH: 0.96 u[IU]/mL (ref 0.350–4.500)

## 2020-03-24 LAB — HEMOGLOBIN A1C
Hgb A1c MFr Bld: 6 % — ABNORMAL HIGH (ref 4.8–5.6)
Mean Plasma Glucose: 125.5 mg/dL

## 2020-03-24 LAB — RESPIRATORY PANEL BY RT PCR (FLU A&B, COVID)
Influenza A by PCR: NEGATIVE
Influenza B by PCR: NEGATIVE
SARS Coronavirus 2 by RT PCR: NEGATIVE

## 2020-03-24 LAB — CBC
HCT: 41.2 % (ref 36.0–46.0)
Hemoglobin: 12.9 g/dL (ref 12.0–15.0)
MCH: 25.7 pg — ABNORMAL LOW (ref 26.0–34.0)
MCHC: 31.3 g/dL (ref 30.0–36.0)
MCV: 82.1 fL (ref 80.0–100.0)
Platelets: 81 10*3/uL — ABNORMAL LOW (ref 150–400)
RBC: 5.02 MIL/uL (ref 3.87–5.11)
RDW: 14.9 % (ref 11.5–15.5)
WBC: 7.9 10*3/uL (ref 4.0–10.5)
nRBC: 0 % (ref 0.0–0.2)

## 2020-03-24 LAB — HIV ANTIBODY (ROUTINE TESTING W REFLEX): HIV Screen 4th Generation wRfx: NONREACTIVE

## 2020-03-24 LAB — TROPONIN I (HIGH SENSITIVITY)
Troponin I (High Sensitivity): 3 ng/L (ref <18)
Troponin I (High Sensitivity): 5 ng/L (ref <18)

## 2020-03-24 MED ORDER — ASPIRIN EC 81 MG PO TBEC
81.0000 mg | DELAYED_RELEASE_TABLET | Freq: Every day | ORAL | Status: DC
  Administered 2020-03-24 – 2020-03-26 (×3): 81 mg via ORAL
  Filled 2020-03-24 (×3): qty 1

## 2020-03-24 MED ORDER — ALBUTEROL SULFATE (2.5 MG/3ML) 0.083% IN NEBU
5.0000 mg | INHALATION_SOLUTION | Freq: Once | RESPIRATORY_TRACT | Status: AC
  Administered 2020-03-24: 5 mg via RESPIRATORY_TRACT
  Filled 2020-03-24: qty 6

## 2020-03-24 MED ORDER — ENOXAPARIN SODIUM 40 MG/0.4ML ~~LOC~~ SOLN: 40.0000 mg | SUBCUTANEOUS | Status: DC

## 2020-03-24 MED ORDER — ACETAMINOPHEN 325 MG PO TABS: 650.0000 mg | ORAL_TABLET | ORAL | Status: DC | PRN

## 2020-03-24 MED ORDER — ASPIRIN 81 MG PO CHEW
324.0000 mg | CHEWABLE_TABLET | Freq: Once | ORAL | Status: AC
  Administered 2020-03-24: 324 mg via ORAL
  Filled 2020-03-24: qty 4

## 2020-03-24 MED ORDER — ONDANSETRON HCL 4 MG/2ML IJ SOLN: 4.0000 mg | Freq: Four times a day (QID) | INTRAMUSCULAR | Status: DC | PRN

## 2020-03-24 MED ORDER — IPRATROPIUM BROMIDE 0.02 % IN SOLN
0.5000 mg | Freq: Once | RESPIRATORY_TRACT | Status: AC
  Administered 2020-03-24: 0.5 mg via RESPIRATORY_TRACT
  Filled 2020-03-24: qty 2.5

## 2020-03-24 NOTE — ED Notes (Signed)
Pt transitioned into hospital bed for comfort.

## 2020-03-24 NOTE — H&P (Signed)
History and Physical    Cynthia Nunez KZS:[id number] DOB: [birth year] DOA: 03/24/2020  PCP: Seward Carol, MD Consultants:  Delilah Shan - OB/GYN; Phebe Colla - breast center Patient coming from:  Home - lives with husband; NOK: Caitlynn, Ju, [phone]   Chief Complaint: Chest pain, syncope  HPI: Cynthia Nunez is a 69 y.o. female with medical history significant of ITP; and ovarian cancer and anal intraepithelial neoplasm presenting with chest pain, syncope.  She reports that she got up overnight to get a drink of water - her chest felt heavy, maybe like acid reflux.  She went to get water and started drinking it.  The next thing she knew she woke up in the floor.  She did not remember falling.  She spilled the water.  Her head was hurting and she was light-headed.  Her husband checked her BP x 3 and it was low, as low as 65/45.  Chest pressure was substernal and felt very full.  It seemed to feel more full with swallowing but it started easing up spontaneously while drinking water but she started feeling light headed.  Earlier in the evening she slipped on the stairs and landed on her buttocks and left shoulder.  She has had similar chest heaviness in the past that improved with drinking water, but never with lightheadedness before.    ED Course:  Carryover, per Alcario Drought:  69 yo F with syncope and chest pain, trops neg, heart score of 5, stable at the moment.  Review of Systems: As per HPI; otherwise review of systems reviewed and negative.   Ambulatory Status:  Ambulates without assistance  COVID Vaccine Status:  Complete  Past Medical History:  Diagnosis Date  . Allergic rhinitis 10-18-11   no longer a problem  . Anal intraepithelial neoplasia I (AIN I), anterior midline, s/p excision [id number] 08/27/2011  . 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 Bronchitis, chronic ([place]) 10-18-11   no recent problems  . Cancer ([place])    Right  ovary-Srumal carcinoma  . Condyloma   . Constipation 10-18-11   chronic- is much improved with diet changes  . Cough 10-18-11   not at this time  . GERD (gastroesophageal reflux disease) 10-18-11   controls with diet-rare occ.  Marland Kitchen Herniated disc 10-18-11   Lumbar disc problem-no problems at  present time.  . ITP (idiopathic thrombocytopenic purpura)   . Skin cyst 10-18-11   right  thigh-posterior near bend of knee    Past Surgical History:  Procedure Laterality Date  . ABDOMINAL HYSTERECTOMY  10-18-11   uterus removed and one ovary removed  . BREAST SURGERY     Biopsy  . BUNIONECTOMY  10-18-11   both foot-retained pins  . Colon mass removed     Benign  . COLONOSCOPY  04/28/2011   Procedure: COLONOSCOPY;  Surgeon: Lear Ng, MD;  Location: [facility];  Service: Endoscopy;  Laterality: N/A;  . colonoscpy  2007  . EXAMINATION UNDER ANESTHESIA  10/22/2011   Procedure: EXAM UNDER ANESTHESIA;  Surgeon: Adin Hector, MD;  Location: [facility];  Service: General;  Laterality: N/A;  . HAND SURGERY  2000 - approximate   left hand-fracture repair  . OOPHORECTOMY     RSO    Social History   Socioeconomic History  . Marital status: Married    Spouse name: Not on file  . Number of children: Not on file  . Years of education: Not on file  .  Highest education level: Not on file  Occupational History  . Occupation: retired  Tobacco Use  . Smoking status: Never Smoker  . Smokeless tobacco: Never Used  Vaping Use  . Vaping Use: Never used  Substance and Sexual Activity  . Alcohol use: Yes    Alcohol/week: 0.0 standard drinks    Comment: occ. to rare social  . Drug use: No  . Sexual activity: Yes    Birth control/protection: Surgical    Comment: HYST-Pt.declined sexual hx questions  Other Topics Concern  . Not on file  Social History Narrative  . Not on file   Social Determinants of Health   Financial Resource Strain:   . Difficulty of Paying Living Expenses: Not on  file  Food Insecurity:   . Worried About Charity fundraiser in the Last Year: Not on file  . Ran Out of Food in the Last Year: Not on file  Transportation Needs:   . Lack of Transportation (Medical): Not on file  . Lack of Transportation (Non-Medical): Not on file  Physical Activity:   . Days of Exercise per Week: Not on file  . Minutes of Exercise per Session: Not on file  Stress:   . Feeling of Stress : Not on file  Social Connections:   . Frequency of Communication with Friends and Family: Not on file  . Frequency of Social Gatherings with Friends and Family: Not on file  . Attends Religious Services: Not on file  . Active Member of Clubs or Organizations: Not on file  . Attends Archivist Meetings: Not on file  . Marital Status: Not on file  Intimate Partner Violence:   . Fear of Current or Ex-Partner: Not on file  . Emotionally Abused: Not on file  . Physically Abused: Not on file  . Sexually Abused: Not on file    Allergies  Allergen Reactions  . Sudafed [Pseudoephedrine Hcl]     Passed out  . Fluconazole     Patient reported. "swelling in joints"  . Sulfamethizole     Patient reported "swelling in joints".  Merril Abbe [Polyethylene Glycol]     constipation    Family History  Problem Relation Age of Onset  . Kidney disease Mother   . Diabetes Paternal Grandmother   . Kidney disease Paternal Grandmother     Prior to Admission medications   Medication Sig Start Date End Date Taking? Authorizing Provider  Cholecalciferol (VITAMIN D PO) Take by mouth.    [provider]    Physical Exam: Vitals:   03/24/20 0915 03/24/20 0930 03/24/20 0939 03/24/20 0944  BP: (!) 156/98 (!) 160/98 (!) 160/98   Pulse:  (!) 59 60   Resp: 16 (!) 21 18   Temp:      TempSrc:      SpO2:  99%  96%     . General:  Appears calm and comfortable and is NAD . Eyes:  PERRL, EOMI, normal lids, iris . ENT:  grossly normal hearing, lips & tongue, mmm; appropriate  dentition . Neck:  no LAD, masses or thyromegaly . Cardiovascular:  RRR, no m/r/g. No LE edema.  Marland Kitchen Respiratory:   CTA bilaterally with no wheezes/rales/rhonchi.  Normal respiratory effort. . Abdomen:  soft, NT, ND, NABS . Skin:  no rash or induration seen on limited exam . Musculoskeletal:  grossly normal tone BUE/BLE, good ROM, no bony abnormality . Psychiatric:  grossly normal mood and affect, speech fluent and appropriate, AOx3 . Neurologic:  CN 2-12 grossly intact, moves all extremities in coordinated fashion    Radiological Exams on Admission: Independently reviewed - see discussion in A/P where applicable  DG Chest Port 1 View  Result Date: 03/24/2020 CLINICAL DATA:  Chest pain EXAM: PORTABLE CHEST 1 VIEW COMPARISON:  04/05/2015 FINDINGS: The lungs are symmetrically well expanded and are clear. No pneumothorax or pleural effusion. 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 Cardiac size is within normal limits. No acute bone abnormality. IMPRESSION: No radiographic evidence of acute cardiopulmonary disease. Stable dilation of the thoracic aorta. Electronically Signed   By: Fidela Salisbury MD   On: 03/24/2020 05:34    EKG: Independently reviewed.   0406 - Sinus bradycardia with rate 45; first degree AV block; nonspecific ST changes with no evidence of acute ischemia 0555 - Sinus bradycardia with rate 49; first degree AV block; nonspecific ST changes with no evidence of acute ischemia   Labs on Admission: I have personally reviewed the available labs and imaging studies at the time of the admission.  Pertinent labs:   Glucose 112 WBC 7.9 Platelets 81 HS troponin 3 COVID/flu negative   Assessment/Plan Principal Problem:   Chest pain of uncertain etiology Active Problems:   Anal intraepithelial neoplasia I (AIN I), anterior midline, s/p excision [id number]   Idiopathic thrombocytopenic purpura (HCC)   Syncope and collapse    Dyslipidemia   Chest pain -Patient with substernal chest pressure that she attributed to reflux, but in the setting of lightheadedness and syncope. -She appeared to have a vagal response, as she reports marked hypotension x 3 at home and had bradycardia here in the ER. -Vital signs have normalized and she is now hypertensive and with a normal HR. -Chest pressure resolved spontaneously. -1/3 typical symptoms suggestive of noncardiac chest pain.  -CXR unremarkable.   -HS troponin negative x 2. -EKG not indicative of acute ischemia.   -HEART pathway score is 5, indicating that the patient has an elevated risk score and requires further evaluation. -Will plan to place in observation status on telemetry to rule out ACS by overnight observation.  -Will start with echo given concern for cardiogenic syncope. -Repeat EKG in AM -Start ASA 81 mg daily -Risk factor stratification with HgbA1c and FLP; will also check TSH and UDS -Cardiology consultation may be needed depending on whether CP recurs and/or echo is abnormal.  Syncope -Etiology appears to be vasovagal syncope based on hemodynamics around the time of the event -Patients at highest risk from syncope include those with serious comorbidities; age >68; exertional > supine syncope; palpitations; abnormal EKG; abnormal vital signs; or abnormal exam. -This patient is at moderate/high risk for serious outcome and thus should be observed overnight on telemetry in the hospital. -Cardiac syncope is more likely when associated with palpitations, heart disease/abnormal EKG, syncope during exertion, and possibly syncope while supine.  It is less likely with precipitating factors or an autonomic prodrome. -History an exam provide the most value in evaluation of syncope; additional testing is generally low yield and high cost. -2d echo is part of the extended work-up when cardiac etiology is suspected -Stress testing is most valuable in patients who have  syncope during or shortly after exercise.  She exercises 6 days a week without issue and so will not order currently, unless echo is abnormal.  HLD -Lipids were checked in 2/20 (TC 228, HDL 73, LDL 140, TG 59) -Repeat lipid panel is pending -She is attempting to increase exercise and  has made dietary changes to avoid statin therapy -Based on repeated uncontrolled HLD on annual testing, she appears to need statin therapy - depending on current lipid profile  H/o AIN -Last seen by GYN in 06/2019 -Appears to be stable/in remission  H/o ITP -No further ITP issues but has persistent and stable thrombocytopenia -Avoid AC    Note: This patient has been tested and is negative for the novel coronavirus COVID-19. She has been fully vaccinated against COVID-19.    DVT prophylaxis:  SCDs Code Status:  Full - confirmed with patient/family Family Communication: Husband was present throughout evaluation Disposition Plan:  The patient is from: home  Anticipated d/c is to: home without [facility] services   Anticipated d/c date will depend on clinical response to treatment, but possibly as early as tomorrow if she has excellent response to treatment  Patient is currently: acutely ill Consults called: None, but may need cardiology depending on echo results Admission status: It is my clinical opinion that referral for OBSERVATION is reasonable and necessary in this patient based on the above information provided. The aforementioned taken together are felt to place the patient at high risk for further clinical deterioration. However it is anticipated that the patient may be medically stable for discharge from the hospital within 24 to 48 hours.     Karmen Bongo MD [facility]   How to contact the [facility] Attending or Consulting provider [place] or covering provider during after hours [place], for this patient?  1. Check the care team in [facility] and look for a) attending/consulting [facility] provider listed and  b) the [facility] team listed 2. Log into [email or web address] and use [place]'s universal password to access. If you do not have the password, please contact the hospital operator. 3. Locate the [facility] provider you are looking for under [facility] and page to a number that you can be directly reached. 4. If you still have difficulty reaching the provider, please page the [facility] (Director on Call) for the Hospitalists listed on amion for assistance.   03/24/2020, 10:06 AM

## 2020-03-24 NOTE — ED Triage Notes (Signed)
Pt states she went downstairs this morning because she was feeling hot, states that she ended up waking up on the floor after her husband came down to find her a few minutes later, reports checking her BP and getting readings in the 20U systolic. BP elevated in triage. Endorses some dizziness and headache now. A/ox4, speech clear, face symmetrical, moves all limbs equally.

## 2020-03-24 NOTE — Progress Notes (Signed)
  Echocardiogram 2D Echocardiogram has been performed.  Darlina Sicilian M 03/24/2020, 3:16 PM

## 2020-03-24 NOTE — ED Provider Notes (Signed)
[facility] EMERGENCY DEPARTMENT Provider Note   CSN: [id number] Arrival date & time: 03/24/20  0357     History Chief Complaint  Patient presents with  . Loss of Consciousness    Cynthia Nunez is a 69 y.o. female.   Loss of Consciousness Associated symptoms: chest pain   Associated symptoms: no fever, no shortness of breath and no vomiting     HPI: A 69 year old patient with a history of hypercholesterolemia presents for evaluation of chest pain. Initial onset of pain was approximately 1-3 hours ago. The patient's chest pain is worse with exertion. The patient's chest pain is middle- or left-sided, is not well-localized, is not described as heaviness/pressure/tightness, is not sharp and does not radiate to the arms/jaw/neck. The patient does not complain of nausea and denies diaphoresis. The patient has no history of stroke, has no history of peripheral artery disease, has not smoked in the past 90 days, denies any history of treated diabetes, has no relevant family history of coronary artery disease (first degree relative at less than age 22), is not hypertensive and does not have an elevated BMI (>=30).  Patient reports she woke up to go downstairs to get a drink of water because she was feeling hot.  She got downstairs and then she woke up on the floor.  She reports immediately for passing out she had chest pain that she describes as reflux No shortness of breath.  She is unsure how long she was on the floor.  No headache or head injury.  No traumatic injuries reported  She reports having these reflux symptoms before, but she has never passed out.  When she checked her blood pressure at home at the time it was very low. She denies any significant medical conditions but does report that she has been told before she has hyperlipidemia.  She is very active and controls this with medications Past Medical History:  Diagnosis Date  . Allergic rhinitis 10-18-11   no  longer a problem  . Anal intraepithelial neoplasia I (AIN I), anterior midline, s/p excision [id number] 08/27/2011  . 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 Bronchitis, chronic ([place]) 10-18-11   no recent problems  . Cancer ([place])    Right ovary-Srumal carcinoma  . Condyloma   . Constipation 10-18-11   chronic- is much improved with diet changes  . Cough 10-18-11   not at this time  . GERD (gastroesophageal reflux disease) 10-18-11   controls with diet-rare occ.  Marland Kitchen Herniated disc 10-18-11   Lumbar disc problem-no problems at  present time.  . ITP (idiopathic thrombocytopenic purpura)   . Skin cyst 10-18-11   right  thigh-posterior near bend of knee    Patient Active Problem List   Diagnosis Date Noted  . ITP (idiopathic thrombocytopenic purpura)   . Cancer ([place])   . Anal intraepithelial neoplasia I (AIN I), anterior midline, s/p excision [id number] 08/27/2011  . Personal history of colonic polyps 04/28/2011    Past Surgical History:  Procedure Laterality Date  . ABDOMINAL HYSTERECTOMY  10-18-11   uterus removed and one ovary removed  . BREAST SURGERY     Biopsy  . BUNIONECTOMY  10-18-11   both foot-retained pins  . Colon mass removed     Benign  . COLONOSCOPY  04/28/2011   Procedure: COLONOSCOPY;  Surgeon: Lear Ng, MD;  Location: [facility];  Service: Endoscopy;  Laterality: N/A;  . colonoscpy  2007  .  EXAMINATION UNDER ANESTHESIA  10/22/2011   Procedure: EXAM UNDER ANESTHESIA;  Surgeon: Adin Hector, MD;  Location: [facility];  Service: General;  Laterality: N/A;  . HAND SURGERY  2000 - approximate   left hand-fracture repair  . OOPHORECTOMY     RSO     OB History    Gravida  0   Para  0   Term  0   Preterm  0   AB  0   Living  0     SAB  0   TAB  0   Ectopic  0   Multiple  0   Live Births  0           Family History  Problem Relation Age of Onset  . Kidney disease Mother   . Diabetes Paternal  Grandmother   . Kidney disease Paternal Grandmother     Social History   Tobacco Use  . Smoking status: Never Smoker  . Smokeless tobacco: Never Used  Vaping Use  . Vaping Use: Never used  Substance Use Topics  . Alcohol use: Yes    Alcohol/week: 0.0 standard drinks    Comment: occ. to rare social  . Drug use: No    Home Medications Prior to Admission medications   Medication Sig Start Date End Date Taking? Authorizing Provider  Cholecalciferol (VITAMIN D PO) Take by mouth.    [provider]    Allergies    Sudafed [pseudoephedrine hcl], Fluconazole, Sulfamethizole, and Miralax [polyethylene glycol]  Review of Systems   Review of Systems  Constitutional: Negative for fever.  Respiratory: Negative for shortness of breath.   Cardiovascular: Positive for chest pain and syncope.  Gastrointestinal: Negative for vomiting.  Neurological: Positive for syncope and light-headedness.  All other systems reviewed and are negative.   Physical Exam Updated Vital Signs BP (!) 167/92   Pulse (!) 50   Temp (!) 97.5 F (36.4 C) (Oral)   Resp 16   SpO2 98%   Physical Exam CONSTITUTIONAL: Well developed/well nourished HEAD: Normocephalic/atraumatic EYES: EOMI/PERRL ENMT: Mucous membranes moist NECK: supple no meningeal signs SPINE/BACK:entire spine nontender CV: S1/S2 noted, no murmurs/rubs/gallops noted LUNGS: Lungs are clear to auscultation bilaterally, no apparent distress ABDOMEN: soft, nontender, no rebound or guarding, bowel sounds noted throughout abdomen GU:no cva tenderness NEURO: Pt is awake/alert/appropriate, moves all extremitiesx4.  No facial droop.  No arm or leg drift.  Normal speech EXTREMITIES: pulses normal/equal, full ROM, no deformities SKIN: warm, color normal PSYCH: no abnormalities of mood noted, alert and oriented to situation  ED Results / Procedures / Treatments   Labs (all labs ordered are listed, but only abnormal results are  displayed) Labs Reviewed  BASIC METABOLIC PANEL - Abnormal; Notable for the following components:      Result Value   Glucose, Bld 112 (*)    All other components within normal limits  CBC - Abnormal; Notable for the following components:   MCH 25.7 (*)    Platelets 81 (*)    All other components within normal limits  RESPIRATORY PANEL BY RT PCR (FLU A&B, COVID)  URINALYSIS, ROUTINE W REFLEX MICROSCOPIC  CBG MONITORING, ED  TROPONIN I (HIGH SENSITIVITY)  TROPONIN I (HIGH SENSITIVITY)    EKG EKG Interpretation  Date/Time:  Monday March 24 2020 04:06:59 EST Ventricular Rate:  46 PR Interval:  214 QRS Duration: 90 QT Interval:  442 QTC Calculation: 386 R Axis:   -62 Text Interpretation: Sinus bradycardia with 1st degree A-V  block Left anterior fascicular block Abnormal ECG Interpretation limited secondary to artifact Confirmed by Ripley Fraise [phone]) on 03/24/2020 4:47:44 AM   EKG Interpretation  Date/Time:  Monday March 24 2020 05:55:57 EST Ventricular Rate:  49 PR Interval:  214 QRS Duration: 94 QT Interval:  456 QTC Calculation: 412 R Axis:   -61 Text Interpretation: Sinus bradycardia Left anterior fascicular block Abnormal R-wave progression, late transition Confirmed by Ripley Fraise [phone]) on 03/24/2020 6:03:37 AM        Radiology DG Chest Port 1 View  Result Date: 03/24/2020 CLINICAL DATA:  Chest pain EXAM: PORTABLE CHEST 1 VIEW COMPARISON:  04/05/2015 FINDINGS: The lungs are symmetrically well expanded and are clear. No pneumothorax or pleural effusion. 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 Cardiac size is within normal limits. No acute bone abnormality. IMPRESSION: No radiographic evidence of acute cardiopulmonary disease. Stable dilation of the thoracic aorta. Electronically Signed   By: Fidela Salisbury MD   On: 03/24/2020 05:34    Procedures Procedures    Medications Ordered in ED Medications   albuterol (PROVENTIL) (2.5 MG/3ML) 0.083% nebulizer solution 5 mg (5 mg Nebulization Given 03/24/20 0614)  ipratropium (ATROVENT) nebulizer solution 0.5 mg (0.5 mg Nebulization Given 03/24/20 0614)  aspirin chewable tablet 324 mg (324 mg Oral Given 03/24/20 1062)   (Patient given albuterol/atrovent in error, pt tolerated well, no immediate complications) ED Course  I have reviewed the triage vital signs and the nursing notes.  Pertinent labs & imaging results that were available during my care of the patient were reviewed by me and considered in my medical decision making (see chart for details).    MDM Rules/Calculators/A&P HEAR Score: 5                        6:04 AM Patient reports she woke up and felt hot and had lightheadedness and passed out.  She also reports having reflux which she has felt before.  She reports she is pain-free at this time. She does not take any medications at baseline.  She is very active and works out frequently.  Patient does have elevated hear score, and in combination with syncope she should be admitted to the hospital.  EKG reveals sinus bradycardia with heart rate 49 but no AV block is identified  Husband reports he found her on floor, no seizures or focal weakness.  She was briefly confused then improved  7:12 AM Patient is chest pain-free Patient still mildly bradycardic, blood pressures remained stable. She denies any tearing or ripping sensation pain into her back. She has equal pulses x4. Low suspicion for aortic dissection. No pleuritic type pain to suggest PE. No murmurs to suggest aortic stenosis We'll admit for chest pain and syncope. Discussed with Dr. Alcario Drought for admission   This patient presents to the ED for concern of chest pain and syncope, this involves an extensive number of treatment options, and is a complaint that carries with it a high risk of complications and morbidity.  The differential diagnosis includes acute coronary  syndrome, pulmonary embolism, aortic dissection, pericarditis, pericardial effusion, AV dysrhythmia   Lab Tests:   I Ordered, reviewed, and interpreted labs, which included CBC, BMP, troponin  Medicines ordered:   I ordered medication ASA  For chest pain   Imaging Studies ordered:   I ordered imaging studies which included chest x-ray   I independently visualized and interpreted imaging which showed no acute findings  Additional history obtained:   Additional history obtained from husband   Consultations Obtained:   I consulted Dr Alcario Drought for admission  and discussed lab and imaging findings  Reevaluation:  After the interventions stated above, I reevaluated the patient and found pt improved    Final Clinical Impression(s) / ED Diagnoses Final diagnoses:  Sinus bradycardia  Chest pain, rule out acute myocardial infarction  Syncope and collapse    Rx / DC Orders ED Discharge Orders    None       Ripley Fraise, MD 03/24/20 [id number]

## 2020-03-25 ENCOUNTER — Inpatient Hospital Stay (HOSPITAL_COMMUNITY): Payer: No Typology Code available for payment source

## 2020-03-25 ENCOUNTER — Other Ambulatory Visit: Payer: Self-pay

## 2020-03-25 DIAGNOSIS — R42 Dizziness and giddiness: Secondary | ICD-10-CM | POA: Diagnosis present

## 2020-03-25 DIAGNOSIS — R001 Bradycardia, unspecified: Secondary | ICD-10-CM | POA: Diagnosis not present

## 2020-03-25 DIAGNOSIS — I440 Atrioventricular block, first degree: Secondary | ICD-10-CM | POA: Diagnosis not present

## 2020-03-25 DIAGNOSIS — D693 Immune thrombocytopenic purpura: Secondary | ICD-10-CM | POA: Diagnosis present

## 2020-03-25 DIAGNOSIS — Z20822 Contact with and (suspected) exposure to covid-19: Secondary | ICD-10-CM | POA: Diagnosis present

## 2020-03-25 DIAGNOSIS — E785 Hyperlipidemia, unspecified: Secondary | ICD-10-CM | POA: Diagnosis present

## 2020-03-25 DIAGNOSIS — R079 Chest pain, unspecified: Secondary | ICD-10-CM

## 2020-03-25 DIAGNOSIS — Z841 Family history of disorders of kidney and ureter: Secondary | ICD-10-CM | POA: Diagnosis not present

## 2020-03-25 DIAGNOSIS — I444 Left anterior fascicular block: Secondary | ICD-10-CM | POA: Diagnosis present

## 2020-03-25 DIAGNOSIS — R0789 Other chest pain: Secondary | ICD-10-CM | POA: Diagnosis present

## 2020-03-25 DIAGNOSIS — R55 Syncope and collapse: Secondary | ICD-10-CM | POA: Diagnosis present

## 2020-03-25 DIAGNOSIS — Z888 Allergy status to other drugs, medicaments and biological substances status: Secondary | ICD-10-CM | POA: Diagnosis not present

## 2020-03-25 DIAGNOSIS — Z882 Allergy status to sulfonamides status: Secondary | ICD-10-CM | POA: Diagnosis not present

## 2020-03-25 DIAGNOSIS — I160 Hypertensive urgency: Secondary | ICD-10-CM | POA: Diagnosis present

## 2020-03-25 DIAGNOSIS — Z85048 Personal history of other malignant neoplasm of rectum, rectosigmoid junction, and anus: Secondary | ICD-10-CM | POA: Diagnosis not present

## 2020-03-25 DIAGNOSIS — I10 Essential (primary) hypertension: Secondary | ICD-10-CM

## 2020-03-25 DIAGNOSIS — Z833 Family history of diabetes mellitus: Secondary | ICD-10-CM | POA: Diagnosis not present

## 2020-03-25 DIAGNOSIS — Z8543 Personal history of malignant neoplasm of ovary: Secondary | ICD-10-CM | POA: Diagnosis not present

## 2020-03-25 LAB — COMPREHENSIVE METABOLIC PANEL
ALT: 16 U/L (ref 0–44)
AST: 20 U/L (ref 15–41)
Albumin: 4 g/dL (ref 3.5–5.0)
Alkaline Phosphatase: 100 U/L (ref 38–126)
Anion gap: 8 (ref 5–15)
BUN: 13 mg/dL (ref 8–23)
CO2: 27 mmol/L (ref 22–32)
Calcium: 10 mg/dL (ref 8.9–10.3)
Chloride: 105 mmol/L (ref 98–111)
Creatinine, Ser: 0.86 mg/dL (ref 0.44–1.00)
GFR, Estimated: >60 mL/min (ref >60)
Glucose, Bld: 92 mg/dL (ref 70–99)
Potassium: 4.8 mmol/L (ref 3.5–5.1)
Sodium: 140 mmol/L (ref 135–145)
Total Bilirubin: 0.5 mg/dL (ref 0.3–1.2)
Total Protein: 6.9 g/dL (ref 6.5–8.1)

## 2020-03-25 MED ORDER — METOPROLOL TARTRATE 5 MG/5ML IV SOLN: 5.0000 mg | Freq: Once | INTRAVENOUS | Status: AC

## 2020-03-25 MED ORDER — AMLODIPINE BESYLATE 5 MG PO TABS
5.0000 mg | ORAL_TABLET | Freq: Every day | ORAL | Status: DC
  Administered 2020-03-25 – 2020-03-26 (×2): 5 mg via ORAL
  Filled 2020-03-25 (×2): qty 1

## 2020-03-25 MED ORDER — NITROGLYCERIN 0.4 MG SL SUBL: 0.8000 mg | SUBLINGUAL_TABLET | Freq: Once | SUBLINGUAL | Status: AC

## 2020-03-25 MED ORDER — NITROGLYCERIN 0.4 MG SL SUBL
SUBLINGUAL_TABLET | SUBLINGUAL | Status: AC
  Administered 2020-03-25: 0.8 mg via SUBLINGUAL
  Filled 2020-03-25: qty 2

## 2020-03-25 MED ORDER — IOHEXOL 350 MG/ML SOLN
80.0000 mL | Freq: Once | INTRAVENOUS | Status: AC | PRN
  Administered 2020-03-25: 80 mL via INTRAVENOUS

## 2020-03-25 MED ORDER — METOPROLOL TARTRATE 5 MG/5ML IV SOLN
INTRAVENOUS | Status: AC
  Administered 2020-03-25: 5 mg via INTRAVENOUS
  Filled 2020-03-25: qty 10

## 2020-03-25 MED ORDER — HYDRALAZINE HCL 20 MG/ML IJ SOLN
5.0000 mg | Freq: Four times a day (QID) | INTRAMUSCULAR | Status: DC | PRN
  Filled 2020-03-25: qty 1

## 2020-03-25 NOTE — Progress Notes (Addendum)
[facility]  PROGRESS NOTE  Cynthia Nunez DQQ:[id number] DOB: 07/08/1950 DOA: 03/24/2020 PCP: Seward Carol, MD Admit date - 03/24/2020   Admitting Physician Karmen Bongo, MD  Outpatient Primary MD for the patient is Seward Carol, MD  LOS - 0 Brief Narrative   Cynthia Nunez is a 69 y.o. year old female with medical history significant for ITP, ovarian cancer, anal intraepithelial neoplasm who presented on 03/24/2020 with complaints of chest pressure and passing out.  My review of patient's history she reports she started feeling ill and more fatigue on the Sunday prior to admission.  And close monitoring of her blood pressure was noted to be at the high as 140s over 90s at the lowest 120s over 80s with heart rate around 83-55.  She states while sleeping she was woken by heaviness/chest pressure in the middle of her chest that did not radiate to her neck or shoulder with no associated diaphoresis, no shortness of breath.  The last time she had pain like this was about 10 years ago.  She denies any new medications.  She went downstairs thinking that glass of water may help after her second glass of water she simply came to have Dr. Pasty Arch on the floor.source of how did she get here.  Her husband was at her side and checked her blood pressure x3 was noted blood pressure 65/45 which prompted her evaluation in the ED.  Of note patient was seen by her PCP in July 2021 and was diagnosed with hyperlipidemia as well as whitecoat hypertension.  Patient elected to try lifestyle changes.  Since then patient reports that she does elliptical training and resistance training 6 times a week, and has changed her diet with no salt regimen, no longer using canned foods, eating more whole food approach, only poultry with frozen/fresh vegetables.  She denies any chest pressure or chest pain or shortness of breath with her increased exercise regimen for the past 4 months.  She states she has lost 10 pounds since  then intentionally.  In the ED on arrival patient was afebrile with blood pressure of 134/88.  Highest blood pressure in the ED was 229/136 with heart rate range 49-59 lab work was notable for negative UDS A1c 6, LDL 1, TSH unremarkable, troponin, EKG nonischemic, UA unremarkable, BNP thrombocytopenia which is chronic chest x-ray nonacute.  Patient was admitted observation to telemetry for evaluation of chest pressure syncope   Subjective  Today states she had no chest pain overnight.  Has not had any recurrent chest pressure since being in the hospital  A & P   Chest pressure, one episode in the setting of hyperlipidemia, and hypertension.  Chest pressure occurred at rest and walking from sleep but otherwise has no typical sepsis signs of cardiac chest pain however, patient does have risk factors including hyperlipidemia, hypertension, and expect atypical presentation in a woman.  Encouraged by TTE with no wall motion abnormalities and preserved EF.  EKG shows no ST changes or T wave inversions but does note sinus bradycardia with first-degree AV block.  No obvious arrhythmias noted on telemetry, troponin trend negative x2. Suspect poorly controlled hypertension could be contributing -Consulted cardiology  -start regimen for blood pressure control -Patient seems amenable to starting statins for high LDL-aspirin started here  Syncope.  Some concern is Related to cardiac given patient passed out after feeling significant amount of chest pressure on admission.  Has had heart rate in low 50s throughout admission and EKG shows first-degree  AV block, otherwise no arrhythmias on telemetry. -Check orthostatics -TSH, TTE, electrolytes all within normal limits  Bradycardia secondary to first-degree AV block.  Possible this could be contributing.  Patient also reports having quite robust exercise regimen which could also contribute to lower heart rate -patient is not on any beta-blockers -continue monitor  on telemetry  Hypertension, currently attempt to control with lifestyle changes.  Patient reports history of whitecoat hypertension has noted her blood pressure in the 140s at home.  Here her BP has been greater than 200, currently 177/90 -Start amlodipine 5 mg -Add hydralazine as needed -Expect patient will need to medications SBP remains greater than 160, hesitant to start to aggressively given 120-140 blood pressure noted at home.  Hyperlipidemia.  LDL still lifestyle changes initiated 4 months ago -Patient seems unable to start statin therapy  ITP, chronic -Platelets stable at goal -No current signs or symptoms of bleeding.     Family Communication  : Husband updated via patient's phone on 11/16  Code Status : Full code  Disposition Plan  :  Patient is from home. Anticipated d/c date: 1 to 2 days. Barriers to d/c or necessity for inpatient status:  Patient was initially admitted under observation, but given patient had quite elevated blood pressure and not on any home regimen in setting of syncope concerning for cardiac issues will need to stay in hospital for continued titration of medication for blood pressure control and to sure no further episodes of syncope. Consults  : Cardiology  Procedures  : TTE, 11/15  DVT Prophylaxis  :  SCDs  MDM: The below labs and imaging reports were reviewed and summarized above.  Medication management as above.  Lab Results  Component Value Date   PLT 81 (L) 03/24/2020    Diet :  Diet Order            Diet Heart Room service appropriate? Yes; Fluid consistency: Thin  Diet effective now                  Inpatient Medications Scheduled Meds: . amLODipine  5 mg Oral Daily  . aspirin EC  81 mg Oral Daily   Continuous Infusions: PRN Meds:.acetaminophen, ondansetron (ZOFRAN) IV  Antibiotics  :   Anti-infectives (From admission, onward)   None       Objective   Vitals:   03/25/20 0145 03/25/20 0215 03/25/20 0247 03/25/20  0909  BP: (!) 137/99 (!) 150/91 (!) 153/111 (!) 177/99  Pulse: 64 (!) 58 63 63  Resp: 15 16 18 16   Temp:  98 F (36.7 C) 97.8 F (36.6 C) 98 F (36.7 C)  TempSrc:  Oral Oral Oral  SpO2: 94% 99% 100% 96%  Weight:   69.1 kg   Height:   5\' 6"  (1.676 m)     SpO2: 96 %  Wt Readings from Last 3 Encounters:  03/25/20 69.1 kg  06/29/19 73.9 kg  06/22/18 71.7 kg     Intake/Output Summary (Last 24 hours) at 03/25/2020 1022 Last data filed at 03/25/2020 0936 Gross per 24 hour  Intake 360 ml  Output 400 ml  Net -40 ml    Physical Exam:  Awake Alert, Oriented X 3, Normal affect Looks younger than stated age No new F.N deficits,  .AT, Normal respiratory effort on room air, CTAB Regular rhythm, slow rate,No Gallops,Rubs or new Murmurs,  +ve B.Sounds, Abd Soft, No tenderness, No rebound, guarding or rigidity. No Cyanosis, No new Rash or bruise  I have personally reviewed the following:   Data Reviewed:  CBC Recent Labs  Lab 03/24/20 0408  WBC 7.9  HGB 12.9  HCT 41.2  PLT 81*  MCV 82.1  MCH 25.7*  MCHC 31.3  RDW 14.9    Chemistries  Recent Labs  Lab 03/24/20 0408  NA 141  K 4.6  CL 107  CO2 25  GLUCOSE 112*  BUN 18  CREATININE 0.99  CALCIUM 9.4   ------------------------------------------------------------------------------------------------------------------ Recent Labs    03/24/20 1442  CHOL 216*  HDL 68  LDLCALC 141*  TRIG 33  CHOLHDL 3.2    Lab Results  Component Value Date   HGBA1C 6.0 (H) 03/24/2020   ------------------------------------------------------------------------------------------------------------------ Recent Labs    03/24/20 1442  TSH 0.960   ------------------------------------------------------------------------------------------------------------------ No results for input(s): VITAMINB12, FOLATE, FERRITIN, TIBC, IRON, RETICCTPCT in the last 72 hours.  Coagulation profile No results for input(s): INR, PROTIME  in the last 168 hours.  No results for input(s): DDIMER in the last 72 hours.  Cardiac Enzymes No results for input(s): CKMB, TROPONINI, MYOGLOBIN in the last 168 hours.  Invalid input(s): CK ------------------------------------------------------------------------------------------------------------------ No results found for: BNP  Micro Results Recent Results (from the past 240 hour(s))  Respiratory Panel by RT PCR (Flu A&B, Covid) - Nasopharyngeal Swab     Status: None   Collection Time: 03/24/20  5:30 AM   Specimen: Nasopharyngeal Swab  Result Value Ref Range Status   SARS Coronavirus 2 by RT PCR NEGATIVE NEGATIVE Final    Comment: (NOTE) SARS-CoV-2 target nucleic acids are NOT DETECTED.  The SARS-CoV-2 RNA is generally detectable in upper respiratoy specimens during the acute phase of infection. The lowest concentration of SARS-CoV-2 viral copies this assay can detect is 131 copies/mL. A negative result does not preclude SARS-Cov-2 infection and should not be used as the sole basis for treatment or other patient management decisions. A negative result may occur with  improper specimen collection/handling, submission of specimen other than nasopharyngeal swab, presence of viral mutation(s) within the areas targeted by this assay, and inadequate number of viral copies (<131 copies/mL). A negative result must be combined with clinical observations, patient history, and epidemiological information. The expected result is Negative.  Fact Sheet for Patients:  PinkCheek.be  Fact Sheet for Healthcare Providers:  GravelBags.it  This test is no t yet approved or cleared by the [place] FDA and  has been authorized for detection and/or diagnosis of SARS-CoV-2 by FDA under an Emergency Use Authorization (EUA). This EUA will remain  in effect (meaning this test can be used) for the duration of the COVID-19 declaration  under Section 564(b)(1) of the Act, 21 U.S.C. section 360bbb-3(b)(1), unless the authorization is terminated or revoked sooner.     Influenza A by PCR NEGATIVE NEGATIVE Final   Influenza B by PCR NEGATIVE NEGATIVE Final    Comment: (NOTE) The Xpert Xpress SARS-CoV-2/FLU/RSV assay is intended as an aid in  the diagnosis of influenza from Nasopharyngeal swab specimens and  should not be used as a sole basis for treatment. Nasal washings and  aspirates are unacceptable for Xpert Xpress SARS-CoV-2/FLU/RSV  testing.  Fact Sheet for Patients: PinkCheek.be  Fact Sheet for Healthcare Providers: GravelBags.it  This test is not yet approved or cleared by the [place] FDA and  has been authorized for detection and/or diagnosis of SARS-CoV-2 by  FDA under an Emergency Use Authorization (EUA). This EUA will remain  in effect (meaning this test can be used) for the duration  of the  Covid-19 declaration under Section 564(b)(1) of the Act, 21  U.S.C. section 360bbb-3(b)(1), unless the authorization is  terminated or revoked. Performed at [facility], [place] [address] [place], [place] [address]     Radiology Reports DG Chest [place] 1 View  Result Date: 03/24/2020 CLINICAL DATA:  Chest pain EXAM: PORTABLE CHEST 1 VIEW COMPARISON:  04/05/2015 FINDINGS: The lungs are symmetrically well expanded and are clear. No pneumothorax or pleural effusion. 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 Cardiac size is within normal limits. No acute bone abnormality. IMPRESSION: No radiographic evidence of acute cardiopulmonary disease. Stable dilation of the thoracic aorta. Electronically Signed   By: Fidela Salisbury MD   On: 03/24/2020 05:34   ECHOCARDIOGRAM COMPLETE  Result Date: 03/24/2020    ECHOCARDIOGRAM REPORT   Patient Name:   Cynthia Nunez Date of Exam: 03/24/2020 Medical Rec #:  [id number]       Height:       66.0 in Accession #:    [id number]     Weight:       163.0 lb Date of Birth:  [birth year]      BSA:          1.833 m Patient Age:    76 years       BP:           158/90 mmHg Patient Gender: F              HR:           54 bpm. Exam Location:  Inpatient Procedure: 2D Echo, Strain Analysis, Color Doppler and Cardiac Doppler Indications:    Chest Pain 786.50 / R07.9  History:        Patient has no prior history of Echocardiogram examinations.                 Syncope.  Sonographer:    Darlina Sicilian RDCS Referring Phys: [place]  1. Left ventricular ejection fraction, by estimation, is 60 to 65%. The left ventricle has normal function. The left ventricle has no regional wall motion abnormalities. Left ventricular diastolic parameters are consistent with Grade I diastolic dysfunction (impaired relaxation). The average left ventricular global longitudinal strain is -22.5 %. The global longitudinal strain is normal.  2. Right ventricular systolic function is normal. The right ventricular size is normal.  3. The mitral valve is normal in structure. No evidence of mitral valve regurgitation. No evidence of mitral stenosis.  4. The aortic valve is normal in structure. Aortic valve regurgitation is not visualized. No aortic stenosis is present.  5. The inferior vena cava is normal in size with greater than 50% respiratory variability, suggesting right atrial pressure of 3 mmHg. FINDINGS  Left Ventricle: Left ventricular ejection fraction, by estimation, is 60 to 65%. The left ventricle has normal function. The left ventricle has no regional wall motion abnormalities. The average left ventricular global longitudinal strain is -22.5 %. The global longitudinal strain is normal. The left ventricular internal cavity size was normal in size. There is no left ventricular hypertrophy. Left ventricular diastolic parameters are consistent with Grade I diastolic dysfunction (impaired relaxation).  Right Ventricle: The right ventricular size is normal. No increase in right ventricular wall thickness. Right ventricular systolic function is normal. Left Atrium: Left atrial size was normal in size. Right Atrium: Right atrial size was normal in size. Pericardium: There is no evidence of pericardial effusion. Mitral Valve:  The mitral valve is normal in structure. No evidence of mitral valve regurgitation. No evidence of mitral valve stenosis. Tricuspid Valve: The tricuspid valve is normal in structure. Tricuspid valve regurgitation is not demonstrated. No evidence of tricuspid stenosis. Aortic Valve: The aortic valve is normal in structure. Aortic valve regurgitation is not visualized. No aortic stenosis is present. Pulmonic Valve: The pulmonic valve was normal in structure. Pulmonic valve regurgitation is not visualized. No evidence of pulmonic stenosis. Aorta: The aortic root is normal in size and structure. Venous: The inferior vena cava is normal in size with greater than 50% respiratory variability, suggesting right atrial pressure of 3 mmHg. IAS/Shunts: No atrial level shunt detected by color flow Doppler.  LEFT VENTRICLE PLAX 2D LVIDd:         3.60 cm  Diastology LVIDs:         2.60 cm  LV e' medial:    5.87 cm/s LV PW:         1.00 cm  LV E/e' medial:  11.5 LV IVS:        1.00 cm  LV e' lateral:   8.05 cm/s LVOT diam:     1.90 cm  LV E/e' lateral: 8.4 LV SV:         64 LV SV Index:   35       2D Longitudinal Strain LVOT Area:     2.84 cm 2D Strain GLS Avg:     -22.5 %  RIGHT VENTRICLE RV S prime:     12.50 cm/s TAPSE (M-mode): 2.5 cm LEFT ATRIUM             Index       RIGHT ATRIUM          Index LA diam:        2.70 cm 1.47 cm/m  RA Area:     9.99 cm LA Vol (A2C):   24.4 ml 13.31 ml/m RA Volume:   21.80 ml 11.89 ml/m LA Vol (A4C):   19.9 ml 10.85 ml/m LA Biplane Vol: 23.5 ml 12.82 ml/m  AORTIC VALVE LVOT Vmax:   121.00 cm/s LVOT Vmean:  79.800 cm/s LVOT VTI:    0.224 m  AORTA Ao Root diam: 3.60 cm  Ao Asc diam:  3.50 cm MITRAL VALVE MV Area (PHT): 3.48 cm    SHUNTS MV Decel Time: 218 msec    Systemic VTI:  0.22 m MV E velocity: 67.70 cm/s  Systemic Diam: 1.90 cm MV A velocity: 59.60 cm/s MV E/A ratio:  1.14 Candee Furbish MD Electronically signed by Candee Furbish MD Signature Date/Time: 03/24/2020/3:23:38 PM    Final      Time Spent in minutes  30     Desiree Hane M.D on 03/25/2020 at 10:22 AM  To page go to [email or web address] - password [email or web address]

## 2020-03-25 NOTE — Plan of Care (Signed)
  Problem: Education: Goal: Knowledge of General Education information will improve Description: Including pain rating scale, medication(s)/side effects and non-pharmacologic comfort measures Outcome: Progressing   Problem: Clinical Measurements: Goal: Cardiovascular complication will be avoided Outcome: Progressing   Problem: Nutrition: Goal: Adequate nutrition will be maintained Outcome: Progressing   Problem: Coping: Goal: Level of anxiety will decrease Outcome: Progressing   Problem: Pain Managment: Goal: General experience of comfort will improve Outcome: Progressing

## 2020-03-25 NOTE — Consult Note (Addendum)
Cardiology Consultation:   Patient ID: Cynthia Nunez MRN: [id number]; DOB: 07-11-1950  Admit date: 03/24/2020 Date of Consult: 03/25/2020  Primary Care Provider: Seward Carol, MD [facility] Cardiologist: [facility] Electrophysiologist:  None    Patient Profile:   Cynthia Nunez is a 69 y.o. female with a hx of ITP, HLD, whitecoat HTN, ovarian cancer and anal intraepithelial neoplasm who is being seen today for the evaluation of chest pain at the request of Dr. Lorin Mercy.  History of Present Illness:   Cynthia Nunez has not been seen by cardiology in the past. No prior ischemic work-up. In July 2021 she was diagnosed with HLD and whitecoat HTN and elected for lifestyle changes. Since then has changed her diet, goes to the gym daily and has lost 10 lbs. No prior tobacco or drug use. She drinks wine occasionally.   The patient presented to the ED 03/24/20 with syncope and chest pain. 2 night ago she woke up to go the bathroom and noticed a centralized chest pressure/tightness. IT was 5/10 and non radiating. No associated symptoms.  It felt like indigestion but was a little different. She decided to go get a drink of water. When she went down stairs the chest pressure worsened. She took 3 drinks of water and the next she remembered was waking up on the kitchen floor. Before falling she denies lightheadedness, dizziness, vision changes, or flushing. She is not sure how long she was out for. Her husband came down when he heard her fall. She awoke with a strange feeling all over her body. HE took her BP and reported hypotension. Patient was brought to the ED for evaluation.  In the ED BP 167/92, pulse 50, afebrile, RR16, 98% O2. Labs showed potassium 4.6, creatinine 0.99, BUN 18, glucose 112, WBC 7.9, Hgb 12.9. HS troponin 3>5. COVID negative. EKG shows SB, 46bpm, 1st degree AVB, LAFB, nonspecific ST/T wave changes. CXR was non-acute. TSH wnl. She was given duonebs and ASA 325mg  in the ED.  Patient was admitted for further work-up.    Past Medical History:  Diagnosis Date   Allergic rhinitis 10-18-11   no longer a problem   Anal intraepithelial neoplasia I (AIN I), anterior midline, s/p excision Jun2013 08/27/2011   Blood dyscrasia 10-18-11   Idiopathic thrombocytopenia purpura-no recent problems   Bronchitis, chronic ([place]) 10-18-11   no recent problems   Cancer ([place])    Right ovary-Srumal carcinoma   Condyloma    Constipation 10-18-11   chronic- is much improved with diet changes   Cough 10-18-11   not at this time   GERD (gastroesophageal reflux disease) 10-18-11   controls with diet-rare occ.   Herniated disc 10-18-11   Lumbar disc problem-no problems at  present time.   ITP (idiopathic thrombocytopenic purpura)    Skin cyst 10-18-11   right  thigh-posterior near bend of knee    Past Surgical History:  Procedure Laterality Date   ABDOMINAL HYSTERECTOMY  10-18-11   uterus removed and one ovary removed   BREAST SURGERY     Biopsy   BUNIONECTOMY  10-18-11   both foot-retained pins   Colon mass removed     Benign   COLONOSCOPY  04/28/2011   Procedure: COLONOSCOPY;  Surgeon: Lear Ng, MD;  Location: [facility] ENDOSCOPY;  Service: Endoscopy;  Laterality: N/A;   colonoscpy  2007   EXAMINATION UNDER ANESTHESIA  10/22/2011   Procedure: EXAM UNDER ANESTHESIA;  Surgeon: Adin Hector, MD;  Location: [facility] ORS;  Service: General;  Laterality: N/A;   HAND SURGERY  2000 - approximate   left hand-fracture repair   OOPHORECTOMY     RSO     Home Medications:  Prior to Admission medications   Medication Sig Start Date End Date Taking? Authorizing Provider  Cholecalciferol (VITAMIN D) 50 MCG (2000 UT) tablet Take 2,000 Units by mouth daily.   Yes [provider]    Inpatient Medications: Scheduled Meds:  amLODipine  5 mg Oral Daily   aspirin EC  81 mg Oral Daily   Continuous Infusions:   PRN Meds: acetaminophen, hydrALAZINE, ondansetron (ZOFRAN)  IV  Allergies:    Allergies  Allergen Reactions   Sudafed [Pseudoephedrine Hcl]     Passed out   Fluconazole     Patient reported. "swelling in joints"   Sulfamethizole     Patient reported "swelling in joints".   Miralax [Polyethylene Glycol]     constipation    Social History:   Social History   Socioeconomic History   Marital status: Married    Spouse name: Not on file   Number of children: Not on file   Years of education: Not on file   Highest education level: Not on file  Occupational History   Occupation: retired  Tobacco Use   Smoking status: Never Smoker   Smokeless tobacco: Never Used  Scientific laboratory technician Use: Never used  Substance and Sexual Activity   Alcohol use: Yes    Alcohol/week: 0.0 standard drinks    Comment: occ. to rare social   Drug use: No   Sexual activity: Yes    Birth control/protection: Surgical    Comment: HYST-Pt.declined sexual hx questions  Other Topics Concern   Not on file  Social History Narrative   Not on file   Social Determinants of Health   Financial Resource Strain:    Difficulty of Paying Living Expenses: Not on file  Food Insecurity:    Worried About [place] in the Last Year: Not on file   Ran Out of Food in the Last Year: Not on file  Transportation Needs:    Lack of Transportation (Medical): Not on file   Lack of Transportation (Non-Medical): Not on file  Physical Activity:    Days of Exercise per Week: Not on file   Minutes of Exercise per Session: Not on file  Stress:    Feeling of Stress : Not on file  Social Connections:    Frequency of Communication with Friends and Family: Not on file   Frequency of Social Gatherings with Friends and Family: Not on file   Attends Religious Services: Not on file   Active Member of Clubs or Organizations: Not on file   Attends Archivist Meetings: Not on file   Marital Status: Not on file  Intimate Partner Violence:    Fear of Current or Ex-Partner:  Not on file   Emotionally Abused: Not on file   Physically Abused: Not on file   Sexually Abused: Not on file    Family History:   Family History  Problem Relation Age of Onset   Kidney disease Mother    Diabetes Paternal Grandmother    Kidney disease Paternal Grandmother      ROS:  Please see the history of present illness.  All other ROS reviewed and negative.     Physical Exam/Data:   Vitals:   03/25/20 0909 03/25/20 1033 03/25/20 1036 03/25/20 1042  BP: (!) 177/99 Marland Kitchen)  185/96 (!) 175/96 (!) 178/110  Pulse: 63 (!) 58 (!) 56 (!) 58  Resp: 16 18    Temp: 98 F (36.7 C) 98.5 F (36.9 C)    TempSrc: Oral Oral    SpO2: 96% 96% 100% 98%  Weight:      Height:        Intake/Output Summary (Last 24 hours) at 03/25/2020 1101 Last data filed at 03/25/2020 0936 Gross per 24 hour  Intake 360 ml  Output 400 ml  Net -40 ml   Last 3 Weights 03/25/2020 06/29/2019 06/22/2018  Weight (lbs) 152 lb 5.4 oz 163 lb 158 lb  Weight (kg) 69.1 kg 73.936 kg 71.668 kg     Body mass index is 24.59 kg/m.  General:  Well nourished, well developed, in no acute distress HEENT: normal Lymph: no adenopathy Neck: no JVD Endocrine:  No thryomegaly Vascular: No carotid bruits; FA pulses 2+ bilaterally without bruits  Cardiac:  normal S1, S2; RRR; no murmur  Lungs:  clear to auscultation bilaterally, no wheezing, rhonchi or rales  Abd: soft, nontender, no hepatomegaly  Ext: no edema Musculoskeletal:  No deformities, BUE and BLE strength normal and equal Skin: warm and dry  Neuro:  CNs 2-12 intact, no focal abnormalities noted Psych:  Normal affect   EKG:  The EKG was personally reviewed and demonstrates:  SB, 46bpm, 1st degree AVB, LAFB, nonspecific ST/T wave changes Telemetry:  Telemetry was personally reviewed and demonstrates:  SR, HR 50-60s  Relevant CV Studies:  Echo 03/24/20   1. Left ventricular ejection fraction, by estimation, is 60 to 65%. The  left ventricle has normal  function. The left ventricle has no regional  wall motion abnormalities. Left ventricular diastolic parameters are  consistent with Grade I diastolic  dysfunction (impaired relaxation). The average left ventricular global  longitudinal strain is -22.5 %. The global longitudinal strain is normal.   2. Right ventricular systolic function is normal. The right ventricular  size is normal.   3. The mitral valve is normal in structure. No evidence of mitral valve  regurgitation. No evidence of mitral stenosis.   4. The aortic valve is normal in structure. Aortic valve regurgitation is  not visualized. No aortic stenosis is present.   5. The inferior vena cava is normal in size with greater than 50%  respiratory variability, suggesting right atrial pressure of 3 mmHg.   Laboratory Data:  High Sensitivity Troponin:   Recent Labs  Lab 03/24/20 0538 03/24/20 0723  TROPONINIHS 3 5     Chemistry Recent Labs  Lab 03/24/20 0408  NA 141  K 4.6  CL 107  CO2 25  GLUCOSE 112*  BUN 18  CREATININE 0.99  CALCIUM 9.4  GFRNONAA >60  ANIONGAP 9    No results for input(s): PROT, ALBUMIN, AST, ALT, ALKPHOS, BILITOT in the last 168 hours. Hematology Recent Labs  Lab 03/24/20 0408  WBC 7.9  RBC 5.02  HGB 12.9  HCT 41.2  MCV 82.1  MCH 25.7*  MCHC 31.3  RDW 14.9  PLT 81*   BNPNo results for input(s): BNP, PROBNP in the last 168 hours.  DDimer No results for input(s): DDIMER in the last 168 hours.   Radiology/Studies:  DG Chest Port 1 View  Result Date: 03/24/2020 CLINICAL DATA:  Chest pain EXAM: PORTABLE CHEST 1 VIEW COMPARISON:  04/05/2015 FINDINGS: The lungs are symmetrically well expanded and are clear. No pneumothorax or pleural effusion. The thoracic aorta is mildly aneurysmal and tortuous, unchanged from  prior examination. Cardiac size is within normal limits. No acute bone abnormality. IMPRESSION: No radiographic evidence of acute cardiopulmonary disease. Stable dilation of the  thoracic aorta. Electronically Signed   By: Fidela Salisbury MD   On: 03/24/2020 05:34   ECHOCARDIOGRAM COMPLETE  Result Date: 03/24/2020    ECHOCARDIOGRAM REPORT   Patient Name:   Cynthia Nunez Hilton Date of Exam: 03/24/2020 Medical Rec #:  [id number]      Height:       66.0 in Accession #:    [id number]     Weight:       163.0 lb Date of Birth:  October 28, 1950      BSA:          1.833 m Patient Age:    36 years       BP:           158/90 mmHg Patient Gender: F              HR:           54 bpm. Exam Location:  Inpatient Procedure: 2D Echo, Strain Analysis, Color Doppler and Cardiac Doppler Indications:    Chest Pain 786.50 / R07.9  History:        Patient has no prior history of Echocardiogram examinations.                 Syncope.  Sonographer:    Darlina Sicilian RDCS Referring Phys: [place]  1. Left ventricular ejection fraction, by estimation, is 60 to 65%. The left ventricle has normal function. The left ventricle has no regional wall motion abnormalities. Left ventricular diastolic parameters are consistent with Grade I diastolic dysfunction (impaired relaxation). The average left ventricular global longitudinal strain is -22.5 %. The global longitudinal strain is normal.  2. Right ventricular systolic function is normal. The right ventricular size is normal.  3. The mitral valve is normal in structure. No evidence of mitral valve regurgitation. No evidence of mitral stenosis.  4. The aortic valve is normal in structure. Aortic valve regurgitation is not visualized. No aortic stenosis is present.  5. The inferior vena cava is normal in size with greater than 50% respiratory variability, suggesting right atrial pressure of 3 mmHg. FINDINGS  Left Ventricle: Left ventricular ejection fraction, by estimation, is 60 to 65%. The left ventricle has normal function. The left ventricle has no regional wall motion abnormalities. The average left ventricular global longitudinal strain is -22.5 %. The  global longitudinal strain is normal. The left ventricular internal cavity size was normal in size. There is no left ventricular hypertrophy. Left ventricular diastolic parameters are consistent with Grade I diastolic dysfunction (impaired relaxation). Right Ventricle: The right ventricular size is normal. No increase in right ventricular wall thickness. Right ventricular systolic function is normal. Left Atrium: Left atrial size was normal in size. Right Atrium: Right atrial size was normal in size. Pericardium: There is no evidence of pericardial effusion. Mitral Valve: The mitral valve is normal in structure. No evidence of mitral valve regurgitation. No evidence of mitral valve stenosis. Tricuspid Valve: The tricuspid valve is normal in structure. Tricuspid valve regurgitation is not demonstrated. No evidence of tricuspid stenosis. Aortic Valve: The aortic valve is normal in structure. Aortic valve regurgitation is not visualized. No aortic stenosis is present. Pulmonic Valve: The pulmonic valve was normal in structure. Pulmonic valve regurgitation is not visualized. No evidence of pulmonic stenosis. Aorta: The aortic root is normal in size and structure. Venous:  The inferior vena cava is normal in size with greater than 50% respiratory variability, suggesting right atrial pressure of 3 mmHg. IAS/Shunts: No atrial level shunt detected by color flow Doppler.  LEFT VENTRICLE PLAX 2D LVIDd:         3.60 cm  Diastology LVIDs:         2.60 cm  LV e' medial:    5.87 cm/s LV PW:         1.00 cm  LV E/e' medial:  11.5 LV IVS:        1.00 cm  LV e' lateral:   8.05 cm/s LVOT diam:     1.90 cm  LV E/e' lateral: 8.4 LV SV:         64 LV SV Index:   35       2D Longitudinal Strain LVOT Area:     2.84 cm 2D Strain GLS Avg:     -22.5 %  RIGHT VENTRICLE RV S prime:     12.50 cm/s TAPSE (M-mode): 2.5 cm LEFT ATRIUM             Index       RIGHT ATRIUM          Index LA diam:        2.70 cm 1.47 cm/m  RA Area:     9.99 cm LA  Vol (A2C):   24.4 ml 13.31 ml/m RA Volume:   21.80 ml 11.89 ml/m LA Vol (A4C):   19.9 ml 10.85 ml/m LA Biplane Vol: 23.5 ml 12.82 ml/m  AORTIC VALVE LVOT Vmax:   121.00 cm/s LVOT Vmean:  79.800 cm/s LVOT VTI:    0.224 m  AORTA Ao Root diam: 3.60 cm Ao Asc diam:  3.50 cm MITRAL VALVE MV Area (PHT): 3.48 cm    SHUNTS MV Decel Time: 218 msec    Systemic VTI:  0.22 m MV E velocity: 67.70 cm/s  Systemic Diam: 1.90 cm MV A velocity: 59.60 cm/s MV E/A ratio:  1.14 Candee Furbish MD Electronically signed by Candee Furbish MD Signature Date/Time: 03/24/2020/3:23:38 PM    Final      Assessment and Plan:   Chest pressure - patient woke up and noted centralized chest pressure worse with exertion and no associated sxs.  - EKG showed SB, 46bpm, 1st degree AVB, LAFB, nonspecific ST/T wave changes - HS troponin negative x2 - No known history of CAD although has RF including HTN and HLD - Hgb A1C 6.0 and LDL 141 - Aspirin started - Echo showed LVEF 60-65%, no WMA, G1DD - No further chest pain since admission. Can consider Myoveiw lexiscan vs coronary CTA to evaluate for CAD  Syncope - On arrival patient was bradycardic and hypertensive.  - orthostatics wnl - TSH wnl - Echo with LVEF 60-65% - no arrhythmias on telemetry.  Mostly SR HR 50-60s - Given description of suddenly blacking out would consider heart monitor at discharge  Bradycardia - patient was bradycardic on admission with first degree AV block. This could possibly contributing to syncope - Patient is not on BB at baseline - TSH wnl - Tele shows SR, heart rates 50-60s  HTN  -  Reports history of whitecoat HTN where Bps at home are generally SBPs in the 120s - In the ER SBP up to 200s and has been consistently high during admission - amlodipine 5mg  started - Bps still up. Avoid BB for bradycardia - Can consider ACE/ARB  HLD - FLP chol 216, HDL 68,  LDL 141 - can consider statin   HEAR Score (for undifferentiated chest pain):  HEAR  Score: 5    For questions or updates, please contact [place] Please consult [email or web address] for contact info under    Signed, Cadence Ninfa Meeker, PA-C  03/25/2020 11:01 AM  Patient examined chart reviewed discussed care with patient and PA. Exam benign with clear lungs, no murmur no edema soft abdomen and good peripheral pulses ECG non acute bradycardia. R/O normal echo. Agree cardiac CTA excellent study to r/o CAD And further w/u dilated aorta noted on CXR. She will not need beta blocker as HR quite low. Discussed contrast, procedure and nitro with patient she is willing to proceed. If we can get 18 g iv in will try to do today. Also agree with 30 day event monitor on d/c even if CTA negative to r/o long pauses as etiology of syncope   Jenkins Rouge MD [facility]

## 2020-03-25 NOTE — Plan of Care (Signed)
Pt verbalizes understanding of her current health status. Pt uses call bell for supervision to the bathroom incase of another syncopal episode. Pt has improving BP.

## 2020-03-26 ENCOUNTER — Other Ambulatory Visit: Payer: Self-pay | Admitting: *Deleted

## 2020-03-26 DIAGNOSIS — R079 Chest pain, unspecified: Secondary | ICD-10-CM | POA: Diagnosis not present

## 2020-03-26 DIAGNOSIS — R55 Syncope and collapse: Secondary | ICD-10-CM

## 2020-03-26 LAB — BASIC METABOLIC PANEL
Anion gap: 7 (ref 5–15)
BUN: 13 mg/dL (ref 8–23)
CO2: 27 mmol/L (ref 22–32)
Calcium: 9.6 mg/dL (ref 8.9–10.3)
Chloride: 105 mmol/L (ref 98–111)
Creatinine, Ser: 0.97 mg/dL (ref 0.44–1.00)
GFR, Estimated: >60 mL/min (ref >60)
Glucose, Bld: 107 mg/dL — ABNORMAL HIGH (ref 70–99)
Potassium: 4.5 mmol/L (ref 3.5–5.1)
Sodium: 139 mmol/L (ref 135–145)

## 2020-03-26 LAB — CBC WITH DIFFERENTIAL/PLATELET
Abs Immature Granulocytes: 0.01 10*3/uL (ref 0.00–0.07)
Basophils Absolute: 0.1 10*3/uL (ref 0.0–0.1)
Basophils Relative: 1 %
Eosinophils Absolute: 0.2 10*3/uL (ref 0.0–0.5)
Eosinophils Relative: 2 %
HCT: 42.1 % (ref 36.0–46.0)
Hemoglobin: 13.4 g/dL (ref 12.0–15.0)
Immature Granulocytes: 0 %
Lymphocytes Relative: 48 %
Lymphs Abs: 3.7 10*3/uL (ref 0.7–4.0)
MCH: 25.5 pg — ABNORMAL LOW (ref 26.0–34.0)
MCHC: 31.8 g/dL (ref 30.0–36.0)
MCV: 80 fL (ref 80.0–100.0)
Monocytes Absolute: 0.7 10*3/uL (ref 0.1–1.0)
Monocytes Relative: 8 %
Neutro Abs: 3.2 10*3/uL (ref 1.7–7.7)
Neutrophils Relative %: 41 %
Platelets: 71 10*3/uL — ABNORMAL LOW (ref 150–400)
RBC: 5.26 MIL/uL — ABNORMAL HIGH (ref 3.87–5.11)
RDW: 14.8 % (ref 11.5–15.5)
WBC: 7.9 10*3/uL (ref 4.0–10.5)
nRBC: 0 % (ref 0.0–0.2)

## 2020-03-26 MED ORDER — AMLODIPINE BESYLATE 5 MG PO TABS: 5.0000 mg | ORAL_TABLET | Freq: Every day | ORAL | 0 refills | Status: AC

## 2020-03-26 NOTE — Progress Notes (Signed)
D/C instructions given and reviewed. No questions asked but encouraged to call with any concerns. Tele and IV's removed, tolerated well. 

## 2020-03-26 NOTE — Progress Notes (Signed)
Subjective:  Denies SSCP, palpitations or Dyspnea Spoke with husband [name] on phone   Objective:  Vitals:   03/26/20 0305 03/26/20 0305 03/26/20 0335 03/26/20 0850  BP:  (!) 139/102 (!) 138/96 (!) 149/100  Pulse:  67    Resp:  16    Temp:  98.4 F (36.9 C)    TempSrc:  Oral    SpO2:  98%    Weight: 68.9 kg     Height:        Intake/Output from previous day:  Intake/Output Summary (Last 24 hours) at 03/26/2020 0926 Last data filed at 03/26/2020 0743 Gross per 24 hour  Intake 720 ml  Output 1200 ml  Net -480 ml    Physical Exam: Affect appropriate Healthy:  appears stated age HEENT: normal Neck supple with no adenopathy JVP normal no bruits no thyromegaly Lungs clear with no wheezing and good diaphragmatic motion Heart:  S1/S2 no murmur, no rub, gallop or click PMI normal Abdomen: benighn, BS positve, no tenderness, no AAA no bruit.  No HSM or HJR Distal pulses intact with no bruits No edema Neuro non-focal Skin warm and dry No muscular weakness   Lab Results: Basic Metabolic Panel: Recent Labs    03/25/20 1148 03/26/20 0341  NA 140 139  K 4.8 4.5  CL 105 105  CO2 27 27  GLUCOSE 92 107*  BUN 13 13  CREATININE 0.86 0.97  CALCIUM 10.0 9.6   Liver Function Tests: Recent Labs    03/25/20 1148  AST 20  ALT 16  ALKPHOS 100  BILITOT 0.5  PROT 6.9  ALBUMIN 4.0   No results for input(s): LIPASE, AMYLASE in the last 72 hours. CBC: Recent Labs    03/24/20 0408 03/26/20 0341  WBC 7.9 7.9  NEUTROABS  --  3.2  HGB 12.9 13.4  HCT 41.2 42.1  MCV 82.1 80.0  PLT 81* 71*   Hemoglobin A1C: Recent Labs    03/24/20 1442  HGBA1C 6.0*   Fasting Lipid Panel: Recent Labs    03/24/20 1442  CHOL 216*  HDL 68  LDLCALC 141*  TRIG 33  CHOLHDL 3.2   Thyroid Function Tests: Recent Labs    03/24/20 1442  TSH 0.960   Anemia Panel: No results for input(s): VITAMINB12, FOLATE, FERRITIN, TIBC, IRON, RETICCTPCT in the last 72  hours.  Imaging: CT CORONARY MORPH W/CTA COR W/SCORE W/CA W/CM &/OR WO/CM  Result Date: 03/25/2020 CLINICAL DATA:  Chest pain EXAM: Cardiac CTA MEDICATIONS: Sub lingual nitro. 4mg  and lopressor 5mg  iv TECHNIQUE: The patient was scanned on a Siemens Force 324 slice scanner. Gantry rotation speed was 250 msecs. Collimation was .6 mm. A 100 kV prospective scan was triggered in the ascending thoracic aorta at 140 HU's Full mA was used between 35% and 75% of the R-R interval. Average HR during the scan was 68 bpm. The 3D data set was interpreted on a dedicated work station using MPR, MIP and VRT modes. A total of 80 cc of contrast was used. FINDINGS: Non-cardiac: See separate report from [facility]. No significant findings on limited lung and soft tissue windows. Calcium Score: Calcium noted in ostium of LM Coronary Arteries: Right dominant with no anomalies LM: 1-24% calcified plaque in left sinus and ostium LM LAD: Normal IM: Normal D1: Normal D2: Normal D3: Normal Circumflex: Small normal OM1: Normal AV Groove: Normal RCA: Normal PDA: Normal PLA: Normal Ascending aortic root 3.6 cm Right coronary sinus 3.75 cm IMPRESSION: 1. Calcium  score 41 which is 74th percentile for age and sex isolated to ostium of LM 2. Upper limites normal ascending aortic root 3.6 cm and right coronary sinus 3.75 cm 3. CAD RADS 1 non obstructive CAD isolated to ostium of LM see above Jenkins Rouge Electronically Signed   By: Jenkins Rouge M.D.   On: 03/25/2020 16:35   ECHOCARDIOGRAM COMPLETE  Result Date: 03/24/2020    ECHOCARDIOGRAM REPORT   Patient Name:   Cynthia Nunez Date of Exam: 03/24/2020 Medical Rec #:  [id number]      Height:       66.0 in Accession #:    [id number]     Weight:       163.0 lb Date of Birth:  1950-11-13      BSA:          1.833 m Patient Age:    69 years       BP:           158/90 mmHg Patient Gender: F              HR:           54 bpm. Exam Location:  Inpatient Procedure: 2D Echo, Strain  Analysis, Color Doppler and Cardiac Doppler Indications:    Chest Pain 786.50 / R07.9  History:        Patient has no prior history of Echocardiogram examinations.                 Syncope.  Sonographer:    Darlina Sicilian RDCS Referring Phys: [place]  1. Left ventricular ejection fraction, by estimation, is 60 to 65%. The left ventricle has normal function. The left ventricle has no regional wall motion abnormalities. Left ventricular diastolic parameters are consistent with Grade I diastolic dysfunction (impaired relaxation). The average left ventricular global longitudinal strain is -22.5 %. The global longitudinal strain is normal.  2. Right ventricular systolic function is normal. The right ventricular size is normal.  3. The mitral valve is normal in structure. No evidence of mitral valve regurgitation. No evidence of mitral stenosis.  4. The aortic valve is normal in structure. Aortic valve regurgitation is not visualized. No aortic stenosis is present.  5. The inferior vena cava is normal in size with greater than 50% respiratory variability, suggesting right atrial pressure of 3 mmHg. FINDINGS  Left Ventricle: Left ventricular ejection fraction, by estimation, is 60 to 65%. The left ventricle has normal function. The left ventricle has no regional wall motion abnormalities. The average left ventricular global longitudinal strain is -22.5 %. The global longitudinal strain is normal. The left ventricular internal cavity size was normal in size. There is no left ventricular hypertrophy. Left ventricular diastolic parameters are consistent with Grade I diastolic dysfunction (impaired relaxation). Right Ventricle: The right ventricular size is normal. No increase in right ventricular wall thickness. Right ventricular systolic function is normal. Left Atrium: Left atrial size was normal in size. Right Atrium: Right atrial size was normal in size. Pericardium: There is no evidence of  pericardial effusion. Mitral Valve: The mitral valve is normal in structure. No evidence of mitral valve regurgitation. No evidence of mitral valve stenosis. Tricuspid Valve: The tricuspid valve is normal in structure. Tricuspid valve regurgitation is not demonstrated. No evidence of tricuspid stenosis. Aortic Valve: The aortic valve is normal in structure. Aortic valve regurgitation is not visualized. No aortic stenosis is present. Pulmonic Valve: The pulmonic valve was normal in structure. Pulmonic valve  regurgitation is not visualized. No evidence of pulmonic stenosis. Aorta: The aortic root is normal in size and structure. Venous: The inferior vena cava is normal in size with greater than 50% respiratory variability, suggesting right atrial pressure of 3 mmHg. IAS/Shunts: No atrial level shunt detected by color flow Doppler.  LEFT VENTRICLE PLAX 2D LVIDd:         3.60 cm  Diastology LVIDs:         2.60 cm  LV e' medial:    5.87 cm/s LV PW:         1.00 cm  LV E/e' medial:  11.5 LV IVS:        1.00 cm  LV e' lateral:   8.05 cm/s LVOT diam:     1.90 cm  LV E/e' lateral: 8.4 LV SV:         64 LV SV Index:   35       2D Longitudinal Strain LVOT Area:     2.84 cm 2D Strain GLS Avg:     -22.5 %  RIGHT VENTRICLE RV S prime:     12.50 cm/s TAPSE (M-mode): 2.5 cm LEFT ATRIUM             Index       RIGHT ATRIUM          Index LA diam:        2.70 cm 1.47 cm/m  RA Area:     9.99 cm LA Vol (A2C):   24.4 ml 13.31 ml/m RA Volume:   21.80 ml 11.89 ml/m LA Vol (A4C):   19.9 ml 10.85 ml/m LA Biplane Vol: 23.5 ml 12.82 ml/m  AORTIC VALVE LVOT Vmax:   121.00 cm/s LVOT Vmean:  79.800 cm/s LVOT VTI:    0.224 m  AORTA Ao Root diam: 3.60 cm Ao Asc diam:  3.50 cm MITRAL VALVE MV Area (PHT): 3.48 cm    SHUNTS MV Decel Time: 218 msec    Systemic VTI:  0.22 m MV E velocity: 67.70 cm/s  Systemic Diam: 1.90 cm MV A velocity: 59.60 cm/s MV E/A ratio:  1.14 Candee Furbish MD Electronically signed by Candee Furbish MD Signature Date/Time:  03/24/2020/3:23:38 PM    Final     Cardiac Studies:  ECG: NSR no acute changes or AV block    Telemetry:  NSR   Echo:   Medications:    amLODipine  5 mg Oral Daily   aspirin EC  81 mg Oral Daily      Assessment/Plan:   1. Syncope:  Appears non cardiac Have arranged outpatient monitor. CTA heart with no obstructive CAD. TTE with normal EF No arrhythmia on telemetry Ok to d/c home continue amlodipine for BP component of white coat syndrome   Jenkins Rouge 03/26/2020, 9:26 AM

## 2020-03-26 NOTE — Plan of Care (Signed)

## 2020-03-26 NOTE — Discharge Summary (Signed)
Physician Discharge Summary  WELTHA CATHY LOV:[id number] DOB: 1951-04-11 DOA: 03/24/2020  PCP: Seward Carol, MD  Admit date: 03/24/2020 Discharge date: 03/26/2020  Admitted From: Home Disposition: Home  Recommendations for Outpatient Follow-up:  1. Follow up with PCP in 1-2 weeks 2. Please obtain BMP/CBC in one week 3. Cardiology as scheduled  Home Health: None Equipment/Devices: None  Discharge Condition: Stable CODE STATUS: Full Diet recommendation: Low-salt, low-fat diet.  Brief/Interim Summary: Cynthia Nunez is a 69 y.o. year old female with medical history significant for ITP, ovarian cancer, anal intraepithelial neoplasm who presented on 03/24/2020 with complaints of chest pressure. Per review of patient's history she reports she started feeling ill and more fatigue on the Sunday prior to admission.  And close monitoring of her blood pressure was noted to be at the high as 140s over 90s at the lowest 120s over 80s with heart rate around 83-55.  She states while sleeping she was woken by heaviness/chest pressure in the middle of her chest that did not radiate to her neck or shoulder with no associated diaphoresis, no shortness of breath.  The last time she had pain like this was about 10 years ago.  She denies any new medications.  She went downstairs thinking that glass of water may help after her second glass of water she simply came to have Dr. Pasty Arch on the floor.source of how did she get here.  Her husband was at her side and checked her blood pressure x3 was noted blood pressure 65/45 which prompted her evaluation in the ED. In July 2021 and was diagnosed with hyperlipidemia as well as whitecoat hypertension.  Patient elected to try lifestyle changes.  Blood pressure in the ED was 229/136 with heart rate range 49-59 lab work was notable for negative UDS A1c 6, LDL 1, TSH unremarkable, troponin, EKG nonischemic, UA unremarkable, BNP thrombocytopenia which is chronic chest x-ray  nonacute.  Patient was admitted observation to telemetry for evaluation of chest pressure.  Patient admitted as above with chest pressure in the setting of hypertensive urgency, blood pressure now markedly well controlled on amlodipine, not currently requiring as needed blood pressure medications.  Patient is well-known to cardiology, while following in house patient had TTE with normal EF, telemetry was unremarkable for dysrhythmia.  Blood pressure markedly well controlled now, otherwise patient stable and agreeable for discharge home.  Close follow-up with PCP Dr. Delfina Redwood in the next 1 to 2 weeks for repeat blood pressure check and medication management.  Outpatient cardiology follow-up as scheduled.  Patient will be discharged on new dose of amlodipine 5 mg, we did discuss initiating low-dose statin which her PCP recommended earlier this year given her age and risk factors for vascular disease benefits likely outweigh risks of this medication.  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 "swelling in joints"   Sulfamethizole    Patient reported "swelling in joints".   Miralax [polyethylene Glycol]    constipation      Medication List    TAKE these medications   amLODipine 5 MG  tablet Commonly known as: NORVASC Take 1 tablet (5 mg total) by mouth daily. Start taking on: March 27, 2020   Vitamin D 50 MCG (2000 UT) tablet Take 2,000 Units by mouth daily.       Follow-up Information    Imogene Burn, PA-C Follow up on 04/16/2020.   Specialty: Cardiology Why: Please arrive 15 minutes early for your 11:45am post-hospital cardiology appointment Contact information: [address] [place] [address] [phone]              Allergies  Allergen Reactions  . Sudafed [Pseudoephedrine Hcl]     Passed out  . Fluconazole     Patient reported. "swelling in joints"  . Sulfamethizole     Patient reported "swelling in joints".  Merril Abbe [Polyethylene Glycol]     constipation    Consultations:  Cardiology   Procedures/Studies: CT CORONARY MORPH W/CTA COR W/SCORE W/CA W/CM &/OR WO/CM  Addendum Date: 03/26/2020   ADDENDUM REPORT: 03/26/2020 10:11 EXAM: OVER-READ INTERPRETATION  CT CHEST The following report is an over-read performed by radiologist Dr. Rebekah Chesterfield [facility] on 03/26/2020. This over-read does not include interpretation of cardiac or coronary anatomy or pathology. The coronary calcium score and cardiac CTA interpretation by the cardiologist is attached. COMPARISON:  None. FINDINGS: Aortic atherosclerosis. Within the visualized portions of the thorax there are no suspicious appearing pulmonary nodules or masses, there is no acute consolidative airspace disease, no pleural effusions, no pneumothorax and no lymphadenopathy. Visualized portions of the upper multiple subcentimeter low-attenuation lesions noted in the liver, too small examination, but statistically to characterize on today's likely to represent tiny cysts. There are no aggressive appearing lytic or blastic lesions noted in the visualized portions of the skeleton. IMPRESSION: 1.  Aortic Atherosclerosis (ICD10-I70.0). Electronically Signed   By:  Vinnie Langton M.D.   On: 03/26/2020 10:11   Result Date: 03/26/2020 CLINICAL DATA:  Chest pain EXAM: Cardiac CTA MEDICATIONS: Sub lingual nitro. 4mg  and lopressor 5mg  iv TECHNIQUE: The patient was scanned on a Siemens Force 951 slice scanner. Gantry rotation speed was 250 msecs. Collimation was .6 mm. A 100 kV prospective scan was triggered in the ascending thoracic aorta at 140 HU's Full mA was used between 35% and 75% of the R-R interval. Average HR during the scan was 68 bpm. The 3D data set was interpreted on a dedicated work station using MPR, MIP and VRT modes. A total of 80 cc of contrast was used. FINDINGS: Non-cardiac: See separate report from [facility]. No significant findings on limited lung and soft tissue windows. Calcium Score: Calcium noted in ostium of LM Coronary Arteries: Right dominant with no anomalies LM: 1-24% calcified plaque in left sinus and ostium LM LAD: Normal IM: Normal D1: Normal D2: Normal D3: Normal Circumflex: Small normal OM1: Normal AV Groove: Normal RCA: Normal PDA: Normal PLA: Normal Ascending aortic root 3.6 cm Right coronary sinus 3.75 cm IMPRESSION: 1. Calcium score 41 which is 74th percentile for age and sex isolated to ostium of LM 2. Upper limites normal ascending aortic root 3.6 cm and right coronary sinus 3.75 cm  3. CAD RADS 1 non obstructive CAD isolated to ostium of LM see above Jenkins Rouge Electronically Signed: By: Jenkins Rouge M.D. On: 03/25/2020 16:35   DG Chest Port 1 View  Result Date: 03/24/2020 CLINICAL DATA:  Chest pain EXAM: PORTABLE CHEST 1 VIEW COMPARISON:  04/05/2015 FINDINGS: The lungs are symmetrically well expanded and are clear. No pneumothorax or pleural effusion. 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 Cardiac size is within normal limits. No acute bone abnormality. IMPRESSION: No radiographic evidence of acute cardiopulmonary disease. Stable dilation of the thoracic aorta.  Electronically Signed   By: Fidela Salisbury MD   On: 03/24/2020 05:34   ECHOCARDIOGRAM COMPLETE  Result Date: 03/24/2020    ECHOCARDIOGRAM REPORT   Patient Name:   DRIANNA CHANDRAN Faulconer Date of Exam: 03/24/2020 Medical Rec #:  [id number]      Height:       66.0 in Accession #:    [id number]     Weight:       163.0 lb Date of Birth:  [birth year]      BSA:          1.833 m Patient Age:    38 years       BP:           158/90 mmHg Patient Gender: F              HR:           54 bpm. Exam Location:  Inpatient Procedure: 2D Echo, Strain Analysis, Color Doppler and Cardiac Doppler Indications:    Chest Pain 786.50 / R07.9  History:        Patient has no prior history of Echocardiogram examinations.                 Syncope.  Sonographer:    Darlina Sicilian RDCS Referring Phys: Stuart  1. Left ventricular ejection fraction, by estimation, is 60 to 65%. The left ventricle has normal function. The left ventricle has no regional wall motion abnormalities. Left ventricular diastolic parameters are consistent with Grade I diastolic dysfunction (impaired relaxation). The average left ventricular global longitudinal strain is -22.5 %. The global longitudinal strain is normal.  2. Right ventricular systolic function is normal. The right ventricular size is normal.  3. The mitral valve is normal in structure. No evidence of mitral valve regurgitation. No evidence of mitral stenosis.  4. The aortic valve is normal in structure. Aortic valve regurgitation is not visualized. No aortic stenosis is present.  5. The inferior vena cava is normal in size with greater than 50% respiratory variability, suggesting right atrial pressure of 3 mmHg. FINDINGS  Left Ventricle: Left ventricular ejection fraction, by estimation, is 60 to 65%. The left ventricle has normal function. The left ventricle has no regional wall motion abnormalities. The average left ventricular global longitudinal strain is -22.5 %. The global longitudinal  strain is normal. The left ventricular internal cavity size was normal in size. There is no left ventricular hypertrophy. Left ventricular diastolic parameters are consistent with Grade I diastolic dysfunction (impaired relaxation). Right Ventricle: The right ventricular size is normal. No increase in right ventricular wall thickness. Right ventricular systolic function is normal. Left Atrium: Left atrial size was normal in size. Right Atrium: Right atrial size was normal in size. Pericardium: There is no evidence of pericardial effusion. Mitral Valve: The mitral valve is normal in structure. No evidence of mitral valve regurgitation. No evidence  of mitral valve stenosis. Tricuspid Valve: The tricuspid valve is normal in structure. Tricuspid valve regurgitation is not demonstrated. No evidence of tricuspid stenosis. Aortic Valve: The aortic valve is normal in structure. Aortic valve regurgitation is not visualized. No aortic stenosis is present. Pulmonic Valve: The pulmonic valve was normal in structure. Pulmonic valve regurgitation is not visualized. No evidence of pulmonic stenosis. Aorta: The aortic root is normal in size and structure. Venous: The inferior vena cava is normal in size with greater than 50% respiratory variability, suggesting right atrial pressure of 3 mmHg. IAS/Shunts: No atrial level shunt detected by color flow Doppler.  LEFT VENTRICLE PLAX 2D LVIDd:         3.60 cm  Diastology LVIDs:         2.60 cm  LV e' medial:    5.87 cm/s LV PW:         1.00 cm  LV E/e' medial:  11.5 LV IVS:        1.00 cm  LV e' lateral:   8.05 cm/s LVOT diam:     1.90 cm  LV E/e' lateral: 8.4 LV SV:         64 LV SV Index:   35       2D Longitudinal Strain LVOT Area:     2.84 cm 2D Strain GLS Avg:     -22.5 %  RIGHT VENTRICLE RV S prime:     12.50 cm/s TAPSE (M-mode): 2.5 cm LEFT ATRIUM             Index       RIGHT ATRIUM          Index LA diam:        2.70 cm 1.47 cm/m  RA Area:     9.99 cm LA Vol (A2C):   24.4 ml  13.31 ml/m RA Volume:   21.80 ml 11.89 ml/m LA Vol (A4C):   19.9 ml 10.85 ml/m LA Biplane Vol: 23.5 ml 12.82 ml/m  AORTIC VALVE LVOT Vmax:   121.00 cm/s LVOT Vmean:  79.800 cm/s LVOT VTI:    0.224 m  AORTA Ao Root diam: 3.60 cm Ao Asc diam:  3.50 cm MITRAL VALVE MV Area (PHT): 3.48 cm    SHUNTS MV Decel Time: 218 msec    Systemic VTI:  0.22 m MV E velocity: 67.70 cm/s  Systemic Diam: 1.90 cm MV A velocity: 59.60 cm/s MV E/A ratio:  1.14 Candee Furbish MD Electronically signed by Candee Furbish MD Signature Date/Time: 03/24/2020/3:23:38 PM    Final      Subjective: No acute issues or events overnight, patient denies chest pain, shortness of breath, nausea, vomiting, diarrhea, constipation, headache, fevers, chills.   Discharge Exam: Vitals:   03/26/20 0850 03/26/20 1140  BP: (!) 149/100 (!) 135/101  Pulse:  74  Resp:  18  Temp:  98.2 F (36.8 C)  SpO2:  100%   Vitals:   03/26/20 0305 03/26/20 0335 03/26/20 0850 03/26/20 1140  BP: (!) 139/102 (!) 138/96 (!) 149/100 (!) 135/101  Pulse: 67   74  Resp: 16   18  Temp: 98.4 F (36.9 C)   98.2 F (36.8 C)  TempSrc: Oral   Oral  SpO2: 98%   100%  Weight:      Height:        General: Pt is alert, awake, not in acute distress Cardiovascular: RRR, S1/S2 +, no rubs, no gallops Respiratory: CTA bilaterally, no wheezing, no rhonchi Abdominal: Soft,  NT, ND, bowel sounds + Extremities: no edema, no cyanosis    The results of significant diagnostics from this hospitalization (including imaging, microbiology, ancillary and laboratory) are listed below for reference.     Microbiology: Recent Results (from the past 240 hour(s))  Respiratory Panel by RT PCR (Flu A&B, Covid) - Nasopharyngeal Swab     Status: None   Collection Time: 03/24/20  5:30 AM   Specimen: Nasopharyngeal Swab  Result Value Ref Range Status   SARS Coronavirus 2 by RT PCR NEGATIVE NEGATIVE Final    Comment: (NOTE) SARS-CoV-2 target nucleic acids are NOT DETECTED.  The  SARS-CoV-2 RNA is generally detectable in upper respiratoy specimens during the acute phase of infection. The lowest concentration of SARS-CoV-2 viral copies this assay can detect is 131 copies/mL. A negative result does not preclude SARS-Cov-2 infection and should not be used as the sole basis for treatment or other patient management decisions. A negative result may occur with  improper specimen collection/handling, submission of specimen other than nasopharyngeal swab, presence of viral mutation(s) within the areas targeted by this assay, and inadequate number of viral copies (<131 copies/mL). A negative result must be combined with clinical observations, patient history, and epidemiological information. The expected result is Negative.  Fact Sheet for Patients:  PinkCheek.be  Fact Sheet for Healthcare Providers:  GravelBags.it  This test is no t yet approved or cleared by the [place] FDA and  has been authorized for detection and/or diagnosis of SARS-CoV-2 by FDA under an Emergency Use Authorization (EUA). This EUA will remain  in effect (meaning this test can be used) for the duration of the COVID-19 declaration under Section 564(b)(1) of the Act, 21 U.S.C. section 360bbb-3(b)(1), unless the authorization is terminated or revoked sooner.     Influenza A by PCR NEGATIVE NEGATIVE Final   Influenza B by PCR NEGATIVE NEGATIVE Final    Comment: (NOTE) The Xpert Xpress SARS-CoV-2/FLU/RSV assay is intended as an aid in  the diagnosis of influenza from Nasopharyngeal swab specimens and  should not be used as a sole basis for treatment. Nasal washings and  aspirates are unacceptable for Xpert Xpress SARS-CoV-2/FLU/RSV  testing.  Fact Sheet for Patients: PinkCheek.be  Fact Sheet for Healthcare Providers: GravelBags.it  This test is not yet approved or cleared  by the [place] FDA and  has been authorized for detection and/or diagnosis of SARS-CoV-2 by  FDA under an Emergency Use Authorization (EUA). This EUA will remain  in effect (meaning this test can be used) for the duration of the  Covid-19 declaration under Section 564(b)(1) of the Act, 21  U.S.C. section 360bbb-3(b)(1), unless the authorization is  terminated or revoked. Performed at [facility], [place] [address], [place], [place] [address]      Labs: BNP (last 3 results) No results for input(s): BNP in the last 8760 hours. Basic Metabolic Panel: Recent Labs  Lab 03/24/20 0408 03/25/20 1148 03/26/20 0341  NA 141 140 139  K 4.6 4.8 4.5  CL 107 105 105  CO2 25 27 27   GLUCOSE 112* 92 107*  BUN 18 13 13   CREATININE 0.99 0.86 0.97  CALCIUM 9.4 10.0 9.6   Liver Function Tests: Recent Labs  Lab 03/25/20 1148  AST 20  ALT 16  ALKPHOS 100  BILITOT 0.5  PROT 6.9  ALBUMIN 4.0   No results for input(s): LIPASE, AMYLASE in the last 168 hours. No results for input(s): AMMONIA in the last 168 hours. CBC: Recent  Labs  Lab 03/24/20 0408 03/26/20 0341  WBC 7.9 7.9  NEUTROABS  --  3.2  HGB 12.9 13.4  HCT 41.2 42.1  MCV 82.1 80.0  PLT 81* 71*   Cardiac Enzymes: No results for input(s): CKTOTAL, CKMB, CKMBINDEX, TROPONINI in the last 168 hours. BNP: Invalid input(s): POCBNP CBG: Recent Labs  Lab 03/24/20 0430  GLUCAP 85   D-Dimer No results for input(s): DDIMER in the last 72 hours. Hgb A1c Recent Labs    03/24/20 1442  HGBA1C 6.0*   Lipid Profile Recent Labs    03/24/20 1442  CHOL 216*  HDL 68  LDLCALC 141*  TRIG 33  CHOLHDL 3.2   Thyroid function studies Recent Labs    03/24/20 1442  TSH 0.960   Anemia work up No results for input(s): VITAMINB12, FOLATE, FERRITIN, TIBC, IRON, RETICCTPCT in the last 72 hours. Urinalysis    Component Value Date/Time   COLORURINE STRAW (A) 03/24/2020 0640   APPEARANCEUR CLEAR 03/24/2020 0640    LABSPEC 1.005 03/24/2020 0640   PHURINE 7.0 03/24/2020 0640   GLUCOSEU NEGATIVE 03/24/2020 0640   HGBUR NEGATIVE 03/24/2020 0640   BILIRUBINUR NEGATIVE 03/24/2020 0640   BILIRUBINUR neg 12/18/2013 1930   KETONESUR NEGATIVE 03/24/2020 0640   PROTEINUR NEGATIVE 03/24/2020 0640   UROBILINOGEN 0.2 10/30/2014 1618   NITRITE NEGATIVE 03/24/2020 0640   LEUKOCYTESUR LARGE (A) 03/24/2020 0640   Sepsis Labs Invalid input(s): PROCALCITONIN,  WBC,  LACTICIDVEN Microbiology Recent Results (from the past 240 hour(s))  Respiratory Panel by RT PCR (Flu A&B, Covid) - Nasopharyngeal Swab     Status: None   Collection Time: 03/24/20  5:30 AM   Specimen: Nasopharyngeal Swab  Result Value Ref Range Status   SARS Coronavirus 2 by RT PCR NEGATIVE NEGATIVE Final    Comment: (NOTE) SARS-CoV-2 target nucleic acids are NOT DETECTED.  The SARS-CoV-2 RNA is generally detectable in upper respiratoy specimens during the acute phase of infection. The lowest concentration of SARS-CoV-2 viral copies this assay can detect is 131 copies/mL. A negative result does not preclude SARS-Cov-2 infection and should not be used as the sole basis for treatment or other patient management decisions. A negative result may occur with  improper specimen collection/handling, submission of specimen other than nasopharyngeal swab, presence of viral mutation(s) within the areas targeted by this assay, and inadequate number of viral copies (<131 copies/mL). A negative result must be combined with clinical observations, patient history, and epidemiological information. The expected result is Negative.  Fact Sheet for Patients:  PinkCheek.be  Fact Sheet for Healthcare Providers:  GravelBags.it  This test is no t yet approved or cleared by the [place] FDA and  has been authorized for detection and/or diagnosis of SARS-CoV-2 by FDA under an Emergency Use Authorization  (EUA). This EUA will remain  in effect (meaning this test can be used) for the duration of the COVID-19 declaration under Section 564(b)(1) of the Act, 21 U.S.C. section 360bbb-3(b)(1), unless the authorization is terminated or revoked sooner.     Influenza A by PCR NEGATIVE NEGATIVE Final   Influenza B by PCR NEGATIVE NEGATIVE Final    Comment: (NOTE) The Xpert Xpress SARS-CoV-2/FLU/RSV assay is intended as an aid in  the diagnosis of influenza from Nasopharyngeal swab specimens and  should not be used as a sole basis for treatment. Nasal washings and  aspirates are unacceptable for Xpert Xpress SARS-CoV-2/FLU/RSV  testing.  Fact Sheet for Patients: PinkCheek.be  Fact Sheet for Healthcare Providers: GravelBags.it  This  test is not yet approved or cleared by the [place] and  has been authorized for detection and/or diagnosis of SARS-CoV-2 by  FDA under an Emergency Use Authorization (EUA). This EUA will remain  in effect (meaning this test can be used) for the duration of the  Covid-19 declaration under Section 564(b)(1) of the Act, 21  U.S.C. section 360bbb-3(b)(1), unless the authorization is  terminated or revoked. Performed at [facility], [place] [address] [place], [place] [address]      Time coordinating discharge: Over 30 minutes  SIGNED:   Little Ishikawa, DO [facility] 03/26/2020, 1:39 PM Pager   If 7PM-7AM, please contact night-coverage [email or web address]

## 2020-04-01 DIAGNOSIS — R21 Rash and other nonspecific skin eruption: Secondary | ICD-10-CM | POA: Diagnosis not present

## 2020-04-01 DIAGNOSIS — E7800 Pure hypercholesterolemia, unspecified: Secondary | ICD-10-CM | POA: Diagnosis not present

## 2020-04-01 DIAGNOSIS — R55 Syncope and collapse: Secondary | ICD-10-CM | POA: Diagnosis not present

## 2020-04-01 DIAGNOSIS — I700 Atherosclerosis of aorta: Secondary | ICD-10-CM | POA: Diagnosis not present

## 2020-04-01 DIAGNOSIS — I10 Essential (primary) hypertension: Secondary | ICD-10-CM | POA: Diagnosis not present

## 2020-04-01 DIAGNOSIS — R7301 Impaired fasting glucose: Secondary | ICD-10-CM | POA: Diagnosis not present

## 2020-04-05 ENCOUNTER — Ambulatory Visit (INDEPENDENT_AMBULATORY_CARE_PROVIDER_SITE_OTHER): Payer: No Typology Code available for payment source

## 2020-04-05 DIAGNOSIS — R55 Syncope and collapse: Secondary | ICD-10-CM | POA: Diagnosis not present

## 2020-04-15 NOTE — Progress Notes (Signed)
Cardiology Office Note    Date:  04/16/2020   ID:  Cynthia Nunez, DOB 06/26/1950, MRN [id number]  PCP:  Seward Carol, MD  Cardiologist: Jenkins Rouge, MD EPS: None  Chief Complaint  Patient presents with  . Hospitalization Follow-up    History of Present Illness:  Cynthia Nunez is a 69 y.o. female with a hx of ITP, HLD, whitecoat HTN, ovarian cancer and anal intraepithelial neoplasm.  Patient was seen in the hospital 03/24/2020 with syncope and chest pain.  2 nights prior to admission she had centralized chest pressure when she woke from sleep.  She went downstairs and took 3 drinks of water and passed out.  Her husband took her blood pressure and said it was low.  On arrival she was bradycardic and hypertensive.  Orthostatics were normal, TSH normal, no arrhythmias on telemetry LVEF 60 to 65% with grade 1 DD.  She was started on amlodipine.  Cardiac CTA 03/25/20 calcium score 41 which is 74th percentile for age and sex isolated to the ostium of the left main, upper limits of normal ascending aortic root 3.6 cm right coronary sinus 3.75 cm, CAD RADS 1 nonobstructive CAD isolated ostium left main.  Holter monitor-this is her last day to wear it.  Patient comes in for f/u. Started exercising this am. Martin Majestic to gym and did 20 min elliptical and 10 min on bike. No complaints and felt good doing it. No further dizziness. Is careful getting up.     Past Medical History:  Diagnosis Date  . Allergic rhinitis 10-18-11   no longer a problem  . Anal intraepithelial neoplasia I (AIN I), anterior midline, s/p excision [id number] 08/27/2011  . 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 Bronchitis, chronic ([place]) 10-18-11   no recent problems  . Cancer ([place])    Right ovary-Srumal carcinoma  . Condyloma   . Constipation 10-18-11   chronic- is much improved with diet changes  . Cough 10-18-11   not at this time  . GERD (gastroesophageal reflux disease)  10-18-11   controls with diet-rare occ.  Marland Kitchen Herniated disc 10-18-11   Lumbar disc problem-no problems at  present time.  . ITP (idiopathic thrombocytopenic purpura)   . Skin cyst 10-18-11   right  thigh-posterior near bend of knee    Past Surgical History:  Procedure Laterality Date  . ABDOMINAL HYSTERECTOMY  10-18-11   uterus removed and one ovary removed  . BREAST SURGERY     Biopsy  . BUNIONECTOMY  10-18-11   both foot-retained pins  . Colon mass removed     Benign  . COLONOSCOPY  04/28/2011   Procedure: COLONOSCOPY;  Surgeon: Lear Ng, MD;  Location: [facility];  Service: Endoscopy;  Laterality: N/A;  . colonoscpy  2007  . EXAMINATION UNDER ANESTHESIA  10/22/2011   Procedure: EXAM UNDER ANESTHESIA;  Surgeon: Adin Hector, MD;  Location: [facility];  Service: General;  Laterality: N/A;  . HAND SURGERY  2000 - approximate   left hand-fracture repair  . OOPHORECTOMY     RSO    Current Medications: Current Meds  Medication Sig  . amLODipine (NORVASC) 5 MG tablet Take 1 tablet (5 mg total) by mouth daily.  . Cholecalciferol (VITAMIN D) 50 MCG (2000 UT) tablet Take 2,000 Units by mouth daily.  . rosuvastatin (CRESTOR) 5 MG tablet Take 5 mg by mouth daily.     Allergies:   Sudafed [pseudoephedrine hcl], Fluconazole, Sulfamethizole, and  Miralax [polyethylene glycol]   Social History   Socioeconomic History  . Marital status: Married    Spouse name: Not on file  . Number of children: Not on file  . Years of education: Not on file  . Highest education level: Not on file  Occupational History  . Occupation: retired  Tobacco Use  . Smoking status: Never Smoker  . Smokeless tobacco: Never Used  Vaping Use  . Vaping Use: Never used  Substance and Sexual Activity  . Alcohol use: Yes    Alcohol/week: 0.0 standard drinks    Comment: occ. to rare social  . Drug use: No  . Sexual activity: Yes    Birth control/protection: Surgical    Comment: HYST-Pt.declined  sexual hx questions  Other Topics Concern  . Not on file  Social History Narrative  . Not on file   Social Determinants of Health   Financial Resource Strain:   . Difficulty of Paying Living Expenses: Not on file  Food Insecurity:   . Worried About Charity fundraiser in the Last Year: Not on file  . Ran Out of Food in the Last Year: Not on file  Transportation Needs:   . Lack of Transportation (Medical): Not on file  . Lack of Transportation (Non-Medical): Not on file  Physical Activity:   . Days of Exercise per Week: Not on file  . Minutes of Exercise per Session: Not on file  Stress:   . Feeling of Stress : Not on file  Social Connections:   . Frequency of Communication with Friends and Family: Not on file  . Frequency of Social Gatherings with Friends and Family: Not on file  . Attends Religious Services: Not on file  . Active Member of Clubs or Organizations: Not on file  . Attends Archivist Meetings: Not on file  . Marital Status: Not on file     Family History:  The patient's   family history includes Diabetes in her paternal grandmother; Kidney disease in her mother and paternal grandmother.   ROS:   Please see the history of present illness.    ROS All other systems reviewed and are negative.   PHYSICAL EXAM:   VS:  BP 116/68   Pulse 62   Ht 5\' 6"  (1.676 m)   Wt 155 lb (70.3 kg)   SpO2 97%   BMI 25.02 kg/m   Physical Exam  GEN: Well nourished, well developed, in no acute distress  Neck: no JVD, carotid bruits, or masses Cardiac:RRR; 4-6/5 systolic murmur  Respiratory:  clear to auscultation bilaterally, normal work of breathing GI: soft, nontender, nondistended, + BS Ext: without cyanosis, clubbing, or edema, Good distal pulses bilaterally Neuro:  Alert and Oriented x 3 Psych: euthymic mood, full affect  Wt Readings from Last 3 Encounters:  04/16/20 155 lb (70.3 kg)  03/26/20 152 lb (68.9 kg)  06/29/19 163 lb (73.9 kg)       Studies/Labs Reviewed:   EKG:  EKG is not ordered today.   Recent Labs: 03/24/2020: TSH 0.960 03/25/2020: ALT 16 03/26/2020: BUN 13; Creatinine, Ser 0.97; Hemoglobin 13.4; Platelets 71; Potassium 4.5; Sodium 139   Lipid Panel    Component Value Date/Time   CHOL 216 (H) 03/24/2020 1442   TRIG 33 03/24/2020 1442   HDL 68 03/24/2020 1442   CHOLHDL 3.2 03/24/2020 1442   VLDL 7 03/24/2020 1442   LDLCALC 141 (H) 03/24/2020 1442   LDLCALC 140 (H) 06/22/2018 1545  Additional studies/ records that were reviewed today include:  Cardiac CTA 03/26/2020 IMPRESSION: 1. Calcium score 41 which is 74th percentile for age and sex isolated to ostium of LM   2. Upper limites normal ascending aortic root 3.6 cm and right coronary sinus 3.75 cm   3. CAD RADS 1 non obstructive CAD isolated to ostium of LM see above   Jenkins Rouge   Electronically Signed: By: Jenkins Rouge M.D. On: 03/25/2020 16:35       ASSESSMENT:    1. Syncope and collapse   2. Essential hypertension   3. Hyperlipidemia, unspecified hyperlipidemia type      PLAN:  In order of problems listed above:  Syncope question etiology CTA of the heart nonobstructive CAD echo normal LVEF, Zio monitor:still wearing monitor-it's her last day.  Hypertension started on amlodipine and BP is well controlled  Hyperlipidemia LDL 141 Dr. Johnsie Cancel recommended a statin and she's on Crestor. Check FLP and LFT's in 3 months. Was exercising 2-3 hrs daily but cut back now.    Medication Adjustments/Labs and Tests Ordered: Current medicines are reviewed at length with the patient today.  Concerns regarding medicines are outlined above.  Medication changes, Labs and Tests ordered today are listed in the Patient Instructions below. Patient Instructions  Medication Instructions:  Your physician recommends that you continue on your current medications as directed. Please refer to the Current Medication list given to you  today.  *If you need a refill on your cardiac medications before your next appointment, please call your pharmacy*   Lab Work: Your physician recommends that you return for a FASTING lipid profile: 07/15/2020  If you have labs (blood work) drawn today and your tests are completely normal, you will receive your results only by: Marland Kitchen MyChart Message (if you have MyChart) OR . A paper copy in the mail If you have any lab test that is abnormal or we need to change your treatment, we will call you to review the results.   Follow-Up: At [facility], you and your health needs are our priority.  As part of our continuing mission to provide you with exceptional heart care, we have created designated Provider Care Teams.  These Care Teams include your primary Cardiologist (physician) and Advanced Practice Providers (APPs -  Physician Assistants and Nurse Practitioners) who all work together to provide you with the care you need, when you need it.  We recommend signing up for the patient portal called "MyChart".  Sign up information is provided on this After Visit Summary.  MyChart is used to connect with patients for Virtual Visits (Telemedicine).  Patients are able to view lab/test results, encounter notes, upcoming appointments, etc.  Non-urgent messages can be sent to your provider as well.   To learn more about what you can do with MyChart, go to [email or web address].    Your next appointment:   4 month(s)  The format for your next appointment:   In Person  Provider:   You may see Jenkins Rouge, MD or one of the following Advanced Practice Providers on your designated Care Team:    Truitt Merle, NP  Cecilie Kicks, NP  Kathyrn Drown, NP       Signed, Ermalinda Barrios, PA-C  04/16/2020 12:50 PM    [address], [place], [place]  [address] Phone: [phone]; Fax: [phone]

## 2020-04-16 ENCOUNTER — Ambulatory Visit (INDEPENDENT_AMBULATORY_CARE_PROVIDER_SITE_OTHER): Payer: No Typology Code available for payment source | Admitting: Physician Assistant

## 2020-04-16 ENCOUNTER — Other Ambulatory Visit: Payer: Self-pay

## 2020-04-16 ENCOUNTER — Encounter: Payer: Self-pay | Admitting: Physician Assistant

## 2020-04-16 VITALS — BP 116/68 | HR 62 | Ht 66.0 in | Wt 155.0 lb

## 2020-04-16 DIAGNOSIS — E785 Hyperlipidemia, unspecified: Secondary | ICD-10-CM | POA: Diagnosis not present

## 2020-04-16 DIAGNOSIS — I10 Essential (primary) hypertension: Secondary | ICD-10-CM | POA: Diagnosis not present

## 2020-04-16 DIAGNOSIS — R55 Syncope and collapse: Secondary | ICD-10-CM

## 2020-04-16 NOTE — Patient Instructions (Signed)
Medication Instructions:  Your physician recommends that you continue on your current medications as directed. Please refer to the Current Medication list given to you today.  *If you need a refill on your cardiac medications before your next appointment, please call your pharmacy*   Lab Work: Your physician recommends that you return for a FASTING lipid profile: 07/15/2020  If you have labs (blood work) drawn today and your tests are completely normal, you will receive your results only by: Marland Kitchen MyChart Message (if you have MyChart) OR . A paper copy in the mail If you have any lab test that is abnormal or we need to change your treatment, we will call you to review the results.   Follow-Up: At [facility], you and your health needs are our priority.  As part of our continuing mission to provide you with exceptional heart care, we have created designated Provider Care Teams.  These Care Teams include your primary Cardiologist (physician) and Advanced Practice Providers (APPs -  Physician Assistants and Nurse Practitioners) who all work together to provide you with the care you need, when you need it.  We recommend signing up for the patient portal called "MyChart".  Sign up information is provided on this After Visit Summary.  MyChart is used to connect with patients for Virtual Visits (Telemedicine).  Patients are able to view lab/test results, encounter notes, upcoming appointments, etc.  Non-urgent messages can be sent to your provider as well.   To learn more about what you can do with MyChart, go to [email or web address].    Your next appointment:   4 month(s)  The format for your next appointment:   In Person  Provider:   You may see Jenkins Rouge, MD or one of the following Advanced Practice Providers on your designated Care Team:    Truitt Merle, NP  Cecilie Kicks, NP  Kathyrn Drown, NP

## 2020-04-23 DIAGNOSIS — M25551 Pain in right hip: Secondary | ICD-10-CM | POA: Diagnosis not present

## 2020-05-22 DIAGNOSIS — Z1231 Encounter for screening mammogram for malignant neoplasm of breast: Secondary | ICD-10-CM | POA: Diagnosis not present

## 2020-07-02 ENCOUNTER — Encounter: Payer: Self-pay | Admitting: Obstetrics and Gynecology

## 2020-07-02 ENCOUNTER — Other Ambulatory Visit: Payer: Self-pay

## 2020-07-02 ENCOUNTER — Ambulatory Visit (INDEPENDENT_AMBULATORY_CARE_PROVIDER_SITE_OTHER): Payer: No Typology Code available for payment source | Admitting: Obstetrics and Gynecology

## 2020-07-02 VITALS — BP 118/70 | Ht 66.0 in | Wt 153.0 lb

## 2020-07-02 DIAGNOSIS — Z01419 Encounter for gynecological examination (general) (routine) without abnormal findings: Secondary | ICD-10-CM | POA: Diagnosis not present

## 2020-07-02 NOTE — Progress Notes (Signed)
    Cynthia Nunez July 17, 1950 [id number]  SUBJECTIVE:  70 y.o. G0P0000 female for annual routine gynecologic exam. She has no gynecologic concerns.     Current Outpatient Medications  Medication Sig Dispense Refill  . amLODipine (NORVASC) 5 MG tablet Take 1 tablet (5 mg total) by mouth daily. 30 tablet 0  . Cholecalciferol (VITAMIN D) 50 MCG (2000 UT) tablet Take 2,000 Units by mouth daily.    . rosuvastatin (CRESTOR) 5 MG tablet Take 5 mg by mouth daily.     No current facility-administered medications for this visit.   Allergies: Sudafed [pseudoephedrine hcl], Fluconazole, Sulfamethizole, and Miralax [polyethylene glycol]  No LMP recorded. Patient has had a hysterectomy.  Past medical history,surgical history, problem list, medications, allergies, family history and social history were all reviewed and documented as reviewed in the EPIC chart.  ROS: Pertinent positives and negatives as reviewed in HPI    OBJECTIVE:  BP 118/70 (BP Location: Right Arm, Patient Position: Sitting, Cuff Size: Normal)   Ht 5\' 6"  (1.676 m)   Wt 153 lb (69.4 kg)   BMI 24.69 kg/m  The patient appears well, alert, oriented, in no distress.  BREAST EXAM: breasts appear normal, no suspicious masses, no skin or nipple changes or axillary nodes   PELVIC EXAM: VULVA: normal appearing vulva with atrophic change, no masses, tenderness or lesions, VAGINA: normal appearing vagina with atrophic change, normal color and discharge, no lesions, CERVIX: surgically absent, UTERUS: surgically absent, vaginal cuff well healed, ADNEXA: no masses, nontender, RECTAL: normal rectal, no masses  Chaperone: Wandra Scot Bonham present during the examination  ASSESSMENT:  70 y.o. G0P0000 here for annual gynecologic exam  PLAN:   1.  Postmenopausal.  History of TVH.  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  Follow-up CT scan was negative.  GYN oncology did not  recommend any further follow-up.  No menopausal concerns at this time. 2. Pap smear 06/2018.  Pap smear not repeated today.  History of anal intraepithelial neoplasia grade 1 in 2013.  Pap smear HPV 2014 was negative and follow-up Pap smear 2018 and 2020 were negative. 3. Mammogram 05/2020 reported per patient.  Will plan to have [facility] send [place] the record.  Normal breast exam today.  She is reminded to schedule an annual mammogram this year. 4. Colonoscopy 2017.  Recommended that she follow up at the recommended interval.   5. DEXA 07/2018  normal.  Next DEXA recommended in 2025. 6. Health maintenance.   No labs today as she recently did these elsewhere.  The patient is aware that I will only be at this practice until early March 2022 so she knows to make sure she requests any needed follow-up when I am no longer at the practice.   Return annually or sooner, prn.  Joseph Pierini MD  07/02/20

## 2020-07-15 ENCOUNTER — Other Ambulatory Visit: Payer: Self-pay

## 2020-07-15 ENCOUNTER — Other Ambulatory Visit: Payer: No Typology Code available for payment source | Admitting: *Deleted

## 2020-07-15 DIAGNOSIS — E785 Hyperlipidemia, unspecified: Secondary | ICD-10-CM

## 2020-07-15 DIAGNOSIS — I10 Essential (primary) hypertension: Secondary | ICD-10-CM

## 2020-07-15 LAB — HEPATIC FUNCTION PANEL
ALT: 14 IU/L (ref 0–32)
AST: 17 IU/L (ref 0–40)
Albumin: 4.5 g/dL (ref 3.8–4.8)
Alkaline Phosphatase: 120 IU/L (ref 44–121)
Bilirubin Total: 0.3 mg/dL (ref 0.0–1.2)
Bilirubin, Direct: 0.1 mg/dL (ref 0.00–0.40)
Total Protein: 6.8 g/dL (ref 6.0–8.5)

## 2020-07-15 LAB — LIPID PANEL
Chol/HDL Ratio: 2.3 ratio (ref 0.0–4.4)
Cholesterol, Total: 143 mg/dL (ref 100–199)
HDL: 63 mg/dL (ref >39)
LDL Chol Calc (NIH): 68 mg/dL (ref 0–99)
Triglycerides: 54 mg/dL (ref 0–149)
VLDL Cholesterol Cal: 12 mg/dL (ref 5–40)

## 2020-08-04 ENCOUNTER — Ambulatory Visit
Admission: RE | Admit: 2020-08-04 | Discharge: 2020-08-04 | Disposition: A | Discharge status: Home / Self Care | Payer: No Typology Code available for payment source | Source: Ambulatory Visit | Attending: Internal Medicine | Admitting: Internal Medicine

## 2020-08-04 ENCOUNTER — Other Ambulatory Visit: Payer: Self-pay | Admitting: Internal Medicine

## 2020-08-04 DIAGNOSIS — M25551 Pain in right hip: Secondary | ICD-10-CM

## 2020-08-04 DIAGNOSIS — M1611 Unilateral primary osteoarthritis, right hip: Secondary | ICD-10-CM | POA: Diagnosis not present

## 2020-08-06 NOTE — Progress Notes (Signed)
Cardiology Office Note    Date:  08/15/2020   ID:  Cynthia Nunez, DOB January 08, 1951, MRN [id number]  PCP:  Seward Carol, MD  Cardiologist: Jenkins Rouge, MD EPS: None  No chief complaint on file.   History of Present Illness:  Cynthia Nunez is a 70 y.o. female with a hx of ITP, HLD, whitecoat HTN, ovarian cancer and anal intraepithelial neoplasm.  Patient was seen in the hospital 03/24/2020 with syncope and chest pain.  2 nights prior to admission she had centralized chest pressure when she woke from sleep.  She went downstairs and took 3 drinks of water and passed out.  Her husband took her blood pressure and said it was low.  On arrival she was bradycardic and hypertensive.  Orthostatics were normal, TSH normal, no arrhythmias on telemetry LVEF 60 to 65% with grade 1 DD.  She was started on amlodipine.  Cardiac CTA 03/25/20 calcium score 41 which is 74th percentile for age and sex isolated to the ostium of the left main, upper limits of normal ascending aortic root 3.6 cm right coronary sinus 3.75 cm, CAD RADS 1 nonobstructive CAD isolated ostium left main.  Holter monitor-04/05/20 no significant arrhythmias even with dizziness   She is doing well with no cardiac symptoms  Compliant with meds Has been vaccinated     Past Medical History:  Diagnosis Date  . Allergic rhinitis 10-18-11   no longer a problem  . Anal intraepithelial neoplasia I (AIN I), anterior midline, s/p excision [id number] 08/27/2011  . 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 Bronchitis, chronic ([place]) 10-18-11   no recent problems  . Cancer ([place])    Right ovary-Srumal carcinoma  . Condyloma   . Constipation 10-18-11   chronic- is much improved with diet changes  . Cough 10-18-11   not at this time  . GERD (gastroesophageal reflux disease) 10-18-11   controls with diet-rare occ.  Marland Kitchen Herniated disc 10-18-11   Lumbar disc problem-no problems at  present time.  . ITP  (idiopathic thrombocytopenic purpura)   . Skin cyst 10-18-11   right  thigh-posterior near bend of knee    Past Surgical History:  Procedure Laterality Date  . ABDOMINAL HYSTERECTOMY  10-18-11   uterus removed and one ovary removed  . BREAST SURGERY     Biopsy  . BUNIONECTOMY  10-18-11   both foot-retained pins  . Colon mass removed     Benign  . COLONOSCOPY  04/28/2011   Procedure: COLONOSCOPY;  Surgeon: Lear Ng, MD;  Location: [facility];  Service: Endoscopy;  Laterality: N/A;  . colonoscpy  2007  . EXAMINATION UNDER ANESTHESIA  10/22/2011   Procedure: EXAM UNDER ANESTHESIA;  Surgeon: Adin Hector, MD;  Location: [facility];  Service: General;  Laterality: N/A;  . HAND SURGERY  2000 - approximate   left hand-fracture repair  . OOPHORECTOMY     RSO    Current Medications: Current Meds  Medication Sig  . amLODipine (NORVASC) 5 MG tablet Take 1 tablet (5 mg total) by mouth daily.  . Cholecalciferol (VITAMIN D) 50 MCG (2000 UT) tablet Take 2,000 Units by mouth daily.  . rosuvastatin (CRESTOR) 5 MG tablet Take 5 mg by mouth daily.     Allergies:   Sudafed [pseudoephedrine hcl], Fluconazole, Sulfamethizole, and Miralax [polyethylene glycol]   Social History   Socioeconomic History  . Marital status: Married    Spouse name: Not on file  . Number  of children: Not on file  . Years of education: Not on file  . Highest education level: Not on file  Occupational History  . Occupation: retired  Tobacco Use  . Smoking status: Never Smoker  . Smokeless tobacco: Never Used  Vaping Use  . Vaping Use: Never used  Substance and Sexual Activity  . Alcohol use: Yes    Alcohol/week: 0.0 standard drinks    Comment: occ. to rare social  . Drug use: No  . Sexual activity: Yes    Birth control/protection: Surgical    Comment: HYST-Pt.declined sexual hx questions  Other Topics Concern  . Not on file  Social History Narrative  . Not on file   Social Determinants of  Health   Financial Resource Strain: Not on file  Food Insecurity: Not on file  Transportation Needs: Not on file  Physical Activity: Not on file  Stress: Not on file  Social Connections: Not on file     Family History:  The patient's   family history includes Diabetes in her paternal grandmother; Kidney disease in her mother and paternal grandmother.   ROS:   Please see the history of present illness.    ROS All other systems reviewed and are negative.   PHYSICAL EXAM:   VS:  BP 120/86   Pulse 63   Ht 5\' 6"  (1.676 m)   Wt 68 kg   SpO2 98%   BMI 24.21 kg/m   Physical Exam  Affect appropriate Healthy:  appears stated age HEENT: normal Neck supple with no adenopathy JVP normal no bruits no thyromegaly Lungs clear with no wheezing and good diaphragmatic motion Heart:  S1/S2 no murmur, no rub, gallop or click PMI normal Abdomen: benighn, BS positve, no tenderness, no AAA no bruit.  No HSM or HJR Distal pulses intact with no bruits No edema Neuro non-focal Skin warm and dry No muscular weakness   Wt Readings from Last 3 Encounters:  08/15/20 68 kg  07/02/20 69.4 kg  04/16/20 70.3 kg      Studies/Labs Reviewed:   EKG:     SR PR 204 msec LAD 03/25/20   Recent Labs: 03/24/2020: TSH 0.960 03/26/2020: BUN 13; Creatinine, Ser 0.97; Hemoglobin 13.4; Platelets 71; Potassium 4.5; Sodium 139 07/15/2020: ALT 14   Lipid Panel    Component Value Date/Time   CHOL 143 07/15/2020 0823   TRIG 54 07/15/2020 0823   HDL 63 07/15/2020 0823   CHOLHDL 2.3 07/15/2020 0823   CHOLHDL 3.2 03/24/2020 1442   VLDL 7 03/24/2020 1442   LDLCALC 68 07/15/2020 0823   LDLCALC 140 (H) 06/22/2018 1545    Additional studies/ records that were reviewed today include:  Cardiac CTA 03/26/2020 IMPRESSION: 1. Calcium score 41 which is 74th percentile for age and sex isolated to ostium of LM   2. Upper limites normal ascending aortic root 3.6 cm and right coronary sinus 3.75 cm   3. CAD  RADS 1 non obstructive CAD isolated to ostium of LM see above   Jenkins Rouge   Electronically Signed: By: Jenkins Rouge M.D. On: 03/25/2020 16:35   Cardiac Telemetry 04/22/20 Study Highlights  NSR No significant arrhythmias  No arrhythmia with symptoms of dizziness First degree AV block no long pauses or higher AV block  ' Echo 03/24/20  IMPRESSIONS    1. Left ventricular ejection fraction, by estimation, is 60 to 65%. The  left ventricle has normal function. The left ventricle has no regional  wall motion abnormalities. Left  ventricular diastolic parameters are  consistent with Grade I diastolic  dysfunction (impaired relaxation). The average left ventricular global  longitudinal strain is -22.5 %. The global longitudinal strain is normal.  2. Right ventricular systolic function is normal. The right ventricular  size is normal.  3. The mitral valve is normal in structure. No evidence of mitral valve  regurgitation. No evidence of mitral stenosis.  4. The aortic valve is normal in structure. Aortic valve regurgitation is  not visualized. No aortic stenosis is present.  5. The inferior vena cava is normal in size with greater than 50%  respiratory variability, suggesting right atrial pressure of 3 mmHg.    ASSESSMENT:    Syncope  PLAN:  In order of problems listed above:  Syncope does not appear to be cardiac etiology with normal echo, cardiac CTA non obstructive disease And monitor 04/05/20 with no arrhythmias even with symptoms of dizziness   Hypertension started on amlodipine 03/27/20  and BP is well controlled  Hyperlipidemia LDL 141 started on crestor with above average calcium score for age LDL on Rx At goal 37 07/15/20   F/U as needed   Medication Adjustments/Labs and Tests Ordered: Current medicines are reviewed at length with the patient today.  Concerns regarding medicines are outlined above.  Medication changes, Labs and Tests ordered today are  listed in the Patient Instructions below. There are no Patient Instructions on file for this visit.   Signed, Jenkins Rouge, MD  08/15/2020 8:33 AM    [facility], [place], [place]  [address] Phone: [phone]; Fax: [phone]

## 2020-08-15 ENCOUNTER — Encounter: Payer: Self-pay | Admitting: Cardiovascular Disease

## 2020-08-15 ENCOUNTER — Ambulatory Visit (INDEPENDENT_AMBULATORY_CARE_PROVIDER_SITE_OTHER): Payer: No Typology Code available for payment source | Admitting: Cardiovascular Disease

## 2020-08-15 ENCOUNTER — Other Ambulatory Visit: Payer: Self-pay

## 2020-08-15 VITALS — BP 120/86 | HR 63 | Ht 66.0 in | Wt 150.0 lb

## 2020-08-15 DIAGNOSIS — I10 Essential (primary) hypertension: Secondary | ICD-10-CM

## 2020-08-15 DIAGNOSIS — E782 Mixed hyperlipidemia: Secondary | ICD-10-CM

## 2020-08-15 DIAGNOSIS — R55 Syncope and collapse: Secondary | ICD-10-CM | POA: Diagnosis not present

## 2020-08-15 NOTE — Patient Instructions (Signed)
Medication Instructions:  *If you need a refill on your cardiac medications before your next appointment, please call your pharmacy*  Lab Work: If you have labs (blood work) drawn today and your tests are completely normal, you will receive your results only by: Marland Kitchen MyChart Message (if you have MyChart) OR . A paper copy in the mail If you have any lab test that is abnormal or we need to change your treatment, we will call you to review the results.  Follow-Up: At [facility], you and your health needs are our priority.  As part of our continuing mission to provide you with exceptional heart care, we have created designated Provider Care Teams.  These Care Teams include your primary Cardiologist (physician) and Advanced Practice Providers (APPs -  Physician Assistants and Nurse Practitioners) who all work together to provide you with the care you need, when you need it.  We recommend signing up for the patient portal called "MyChart".  Sign up information is provided on this After Visit Summary.  MyChart is used to connect with patients for Virtual Visits (Telemedicine).  Patients are able to view lab/test results, encounter notes, upcoming appointments, etc.  Non-urgent messages can be sent to your provider as well.   To learn more about what you can do with MyChart, go to [email or web address].    Your next appointment:   As needed  The format for your next appointment:   In Person  Provider:   You may see Jenkins Rouge, MD or one of the following Advanced Practice Providers on your designated Care Team:    Kathyrn Drown, NP

## 2020-10-30 DIAGNOSIS — H40003 Preglaucoma, unspecified, bilateral: Secondary | ICD-10-CM | POA: Diagnosis not present

## 2020-12-08 DIAGNOSIS — E7800 Pure hypercholesterolemia, unspecified: Secondary | ICD-10-CM | POA: Diagnosis not present

## 2020-12-08 DIAGNOSIS — Z23 Encounter for immunization: Secondary | ICD-10-CM | POA: Diagnosis not present

## 2020-12-08 DIAGNOSIS — Z0000 Encounter for general adult medical examination without abnormal findings: Secondary | ICD-10-CM | POA: Diagnosis not present

## 2020-12-08 DIAGNOSIS — I10 Essential (primary) hypertension: Secondary | ICD-10-CM | POA: Diagnosis not present

## 2020-12-08 DIAGNOSIS — I700 Atherosclerosis of aorta: Secondary | ICD-10-CM | POA: Diagnosis not present

## 2020-12-08 DIAGNOSIS — R7309 Other abnormal glucose: Secondary | ICD-10-CM | POA: Diagnosis not present

## 2020-12-08 DIAGNOSIS — D696 Thrombocytopenia, unspecified: Secondary | ICD-10-CM | POA: Diagnosis not present

## 2020-12-08 DIAGNOSIS — R7301 Impaired fasting glucose: Secondary | ICD-10-CM | POA: Diagnosis not present

## 2021-02-13 DIAGNOSIS — M5442 Lumbago with sciatica, left side: Secondary | ICD-10-CM | POA: Diagnosis not present

## 2021-04-21 DIAGNOSIS — Z86010 Personal history of colonic polyps: Secondary | ICD-10-CM | POA: Diagnosis not present

## 2021-05-28 DIAGNOSIS — Z1231 Encounter for screening mammogram for malignant neoplasm of breast: Secondary | ICD-10-CM | POA: Diagnosis not present

## 2021-05-29 ENCOUNTER — Encounter: Payer: Self-pay | Admitting: Obstetrics & Gynecology

## 2021-07-03 ENCOUNTER — Other Ambulatory Visit: Payer: Self-pay

## 2021-07-03 ENCOUNTER — Other Ambulatory Visit (HOSPITAL_COMMUNITY)
Admission: RE | Admit: 2021-07-03 | Discharge: 2021-07-03 | Disposition: A | Discharge status: Home / Self Care | Payer: 59 | Source: Ambulatory Visit | Attending: Obstetrics & Gynecology | Admitting: Obstetrics & Gynecology

## 2021-07-03 ENCOUNTER — Encounter: Payer: Self-pay | Admitting: Obstetrics & Gynecology

## 2021-07-03 ENCOUNTER — Ambulatory Visit (INDEPENDENT_AMBULATORY_CARE_PROVIDER_SITE_OTHER): Payer: 59 | Admitting: Obstetrics & Gynecology

## 2021-07-03 VITALS — BP 114/70 | HR 68 | Resp 16 | Ht 65.0 in | Wt 152.0 lb

## 2021-07-03 DIAGNOSIS — K6282 Dysplasia of anus: Secondary | ICD-10-CM | POA: Insufficient documentation

## 2021-07-03 DIAGNOSIS — Z01419 Encounter for gynecological examination (general) (routine) without abnormal findings: Secondary | ICD-10-CM

## 2021-07-03 DIAGNOSIS — Z9189 Other specified personal risk factors, not elsewhere classified: Secondary | ICD-10-CM

## 2021-07-03 DIAGNOSIS — Z1272 Encounter for screening for malignant neoplasm of vagina: Secondary | ICD-10-CM | POA: Insufficient documentation

## 2021-07-03 DIAGNOSIS — Z90710 Acquired absence of both cervix and uterus: Secondary | ICD-10-CM | POA: Diagnosis not present

## 2021-07-03 DIAGNOSIS — Z780 Asymptomatic menopausal state: Secondary | ICD-10-CM | POA: Diagnosis not present

## 2021-07-03 NOTE — Progress Notes (Signed)
Cynthia Nunez 1950-08-08 [id number]   History:    71 y.o. G0  RP:  Established patient presenting for annual gyn exam   HPI: Postmenopausal, well on no HRT.  History of TVH.  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  Follow-up CT scan was negative. GYN oncology did not recommend any further follow-up. Abstinent.  Pap smear Neg in 06/2018.  History of anal intraepithelial neoplasia grade 1 in 2013. Breasts normal. Mammogram 05/2021 Negative. Good BMI at 25.29.  Continue with fitness, goes to Gym at 5:30 am everyday. Colonoscopy 2017, scheduling this year. DEXA 07/2018  normal.  Next DEXA recommended in 2025. Health labs with Fam MD.     Past medical history,surgical history, family history and social history were all reviewed and documented in the EPIC chart.  Gynecologic History No LMP recorded. Patient has had a hysterectomy.  Obstetric History OB History  Gravida Para Term Preterm AB Living  0 0 0 0 0 0  SAB IAB Ectopic Multiple Live Births  0 0 0 0 0     ROS: A ROS was performed and pertinent positives and negatives are included in the history.  GENERAL: No fevers or chills. HEENT: No change in vision, no earache, sore throat or sinus congestion. NECK: No pain or stiffness. CARDIOVASCULAR: No chest pain or pressure. No palpitations. PULMONARY: No shortness of breath, cough or wheeze. GASTROINTESTINAL: No abdominal pain, nausea, vomiting or diarrhea, melena or bright red blood per rectum. GENITOURINARY: No urinary frequency, urgency, hesitancy or dysuria. MUSCULOSKELETAL: No joint or muscle pain, no back pain, no recent trauma. DERMATOLOGIC: No rash, no itching, no lesions. ENDOCRINE: No polyuria, polydipsia, no heat or cold intolerance. No recent change in weight. HEMATOLOGICAL: No anemia or easy bruising or bleeding. NEUROLOGIC: No headache, seizures, numbness, tingling or weakness. PSYCHIATRIC: No depression, no loss of  interest in normal activity or change in sleep pattern.     Exam:   BP 114/70    Pulse 68    Resp 16    Ht 5\' 5"  (1.651 m)    Wt 152 lb (68.9 kg)    BMI 25.29 kg/m   Body mass index is 25.29 kg/m.  General appearance : Well developed well nourished female. No acute distress HEENT: Eyes: no retinal hemorrhage or exudates,  Neck supple, trachea midline, no carotid bruits, no thyroidmegaly Lungs: Clear to auscultation, no rhonchi or wheezes, or rib retractions  Heart: Regular rate and rhythm, no murmurs or gallops Breast:Examined in sitting and supine position were symmetrical in appearance, no palpable masses or tenderness,  no skin retraction, no nipple inversion, no nipple discharge, no skin discoloration, no axillary or supraclavicular lymphadenopathy Abdomen: no palpable masses or tenderness, no rebound or guarding Extremities: no edema or skin discoloration or tenderness  Pelvic: Vulva: Normal             Vagina: No gross lesions or discharge.  Pap reflex done.  Cervix/Uterus absent  Adnexa  Without masses or tenderness  Anus: Normal   Assessment/Plan:  71 y.o. female for annual exam   1. Encounter for Papanicolaou smear of vagina as part of routine gynecological examination Postmenopausal, well on no HRT.  History of TVH.  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  Follow-up CT scan was negative. GYN oncology did not recommend any further follow-up. Abstinent.  Pap  smear Neg in 06/2018.  History of anal intraepithelial neoplasia grade 1 in 2013. Breasts normal. Mammogram 05/2021 Negative. Good BMI at 25.29.  Continue with fitness, goes to Gym at 5:30 am everyday. Colonoscopy 2017, scheduling this year. DEXA 07/2018  normal.  Next DEXA recommended in 2025. Health labs with Fam MD.   - Cytology - PAP( [facility])  2. At risk for infection  3. Mild anal dysplasia - Cytology - PAP( [facility])  4. S/P total hysterectomy  with BSO  5. Postmenopause Postmenopausal, well on no HRT.  History of TVH.  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  Follow-up CT scan was negative. GYN oncology did not recommend any further follow-up. Abstinent.   Other orders - meloxicam (MOBIC) 15 MG tablet; Take 15 mg by mouth daily.   Princess Bruins MD, 9:12 AM 07/03/2021

## 2021-07-07 LAB — CYTOLOGY - PAP: Diagnosis: NEGATIVE

## 2021-08-04 DIAGNOSIS — K5730 Diverticulosis of large intestine without perforation or abscess without bleeding: Secondary | ICD-10-CM | POA: Diagnosis not present

## 2021-08-04 DIAGNOSIS — K635 Polyp of colon: Secondary | ICD-10-CM | POA: Diagnosis not present

## 2021-08-04 DIAGNOSIS — K648 Other hemorrhoids: Secondary | ICD-10-CM | POA: Diagnosis not present

## 2021-08-04 DIAGNOSIS — Z86010 Personal history of colonic polyps: Secondary | ICD-10-CM | POA: Diagnosis not present

## 2021-08-06 DIAGNOSIS — K635 Polyp of colon: Secondary | ICD-10-CM | POA: Diagnosis not present

## 2021-08-28 DIAGNOSIS — M25559 Pain in unspecified hip: Secondary | ICD-10-CM | POA: Diagnosis not present

## 2021-08-28 DIAGNOSIS — M545 Low back pain, unspecified: Secondary | ICD-10-CM | POA: Diagnosis not present

## 2021-09-01 ENCOUNTER — Other Ambulatory Visit: Payer: Self-pay | Admitting: Internal Medicine

## 2021-09-01 ENCOUNTER — Ambulatory Visit
Admission: RE | Admit: 2021-09-01 | Discharge: 2021-09-01 | Disposition: A | Discharge status: Home / Self Care | Payer: 59 | Source: Ambulatory Visit | Attending: Internal Medicine | Admitting: Internal Medicine

## 2021-09-01 DIAGNOSIS — M25559 Pain in unspecified hip: Secondary | ICD-10-CM

## 2021-09-01 DIAGNOSIS — M25551 Pain in right hip: Secondary | ICD-10-CM | POA: Diagnosis not present

## 2021-09-15 DIAGNOSIS — M5451 Vertebrogenic low back pain: Secondary | ICD-10-CM | POA: Diagnosis not present

## 2021-10-28 DIAGNOSIS — H40013 Open angle with borderline findings, low risk, bilateral: Secondary | ICD-10-CM | POA: Diagnosis not present

## 2021-11-04 DIAGNOSIS — Z0100 Encounter for examination of eyes and vision without abnormal findings: Secondary | ICD-10-CM | POA: Diagnosis not present

## 2021-12-24 DIAGNOSIS — I700 Atherosclerosis of aorta: Secondary | ICD-10-CM | POA: Diagnosis not present

## 2021-12-24 DIAGNOSIS — I10 Essential (primary) hypertension: Secondary | ICD-10-CM | POA: Diagnosis not present

## 2021-12-24 DIAGNOSIS — Z0000 Encounter for general adult medical examination without abnormal findings: Secondary | ICD-10-CM | POA: Diagnosis not present

## 2021-12-24 DIAGNOSIS — M5442 Lumbago with sciatica, left side: Secondary | ICD-10-CM | POA: Diagnosis not present

## 2021-12-24 DIAGNOSIS — D696 Thrombocytopenia, unspecified: Secondary | ICD-10-CM | POA: Diagnosis not present

## 2021-12-30 DIAGNOSIS — M545 Low back pain, unspecified: Secondary | ICD-10-CM | POA: Diagnosis not present

## 2022-01-02 DIAGNOSIS — M545 Low back pain, unspecified: Secondary | ICD-10-CM | POA: Diagnosis not present

## 2022-01-07 DIAGNOSIS — M545 Low back pain, unspecified: Secondary | ICD-10-CM | POA: Diagnosis not present

## 2022-03-30 DIAGNOSIS — R0981 Nasal congestion: Secondary | ICD-10-CM | POA: Diagnosis not present

## 2022-03-30 DIAGNOSIS — J040 Acute laryngitis: Secondary | ICD-10-CM | POA: Diagnosis not present

## 2022-04-05 IMAGING — DX DG LUMBAR SPINE 2-3V
3 series · 3 of 3 positions shown · non-contrast
Comparison: None.

CLINICAL DATA: Low back pain with right-sided sciatica. No known
injury.

EXAM:
LUMBAR SPINE - 2-3 VIEW

[dg lumbar spine 2-3 views (1 of 3)]
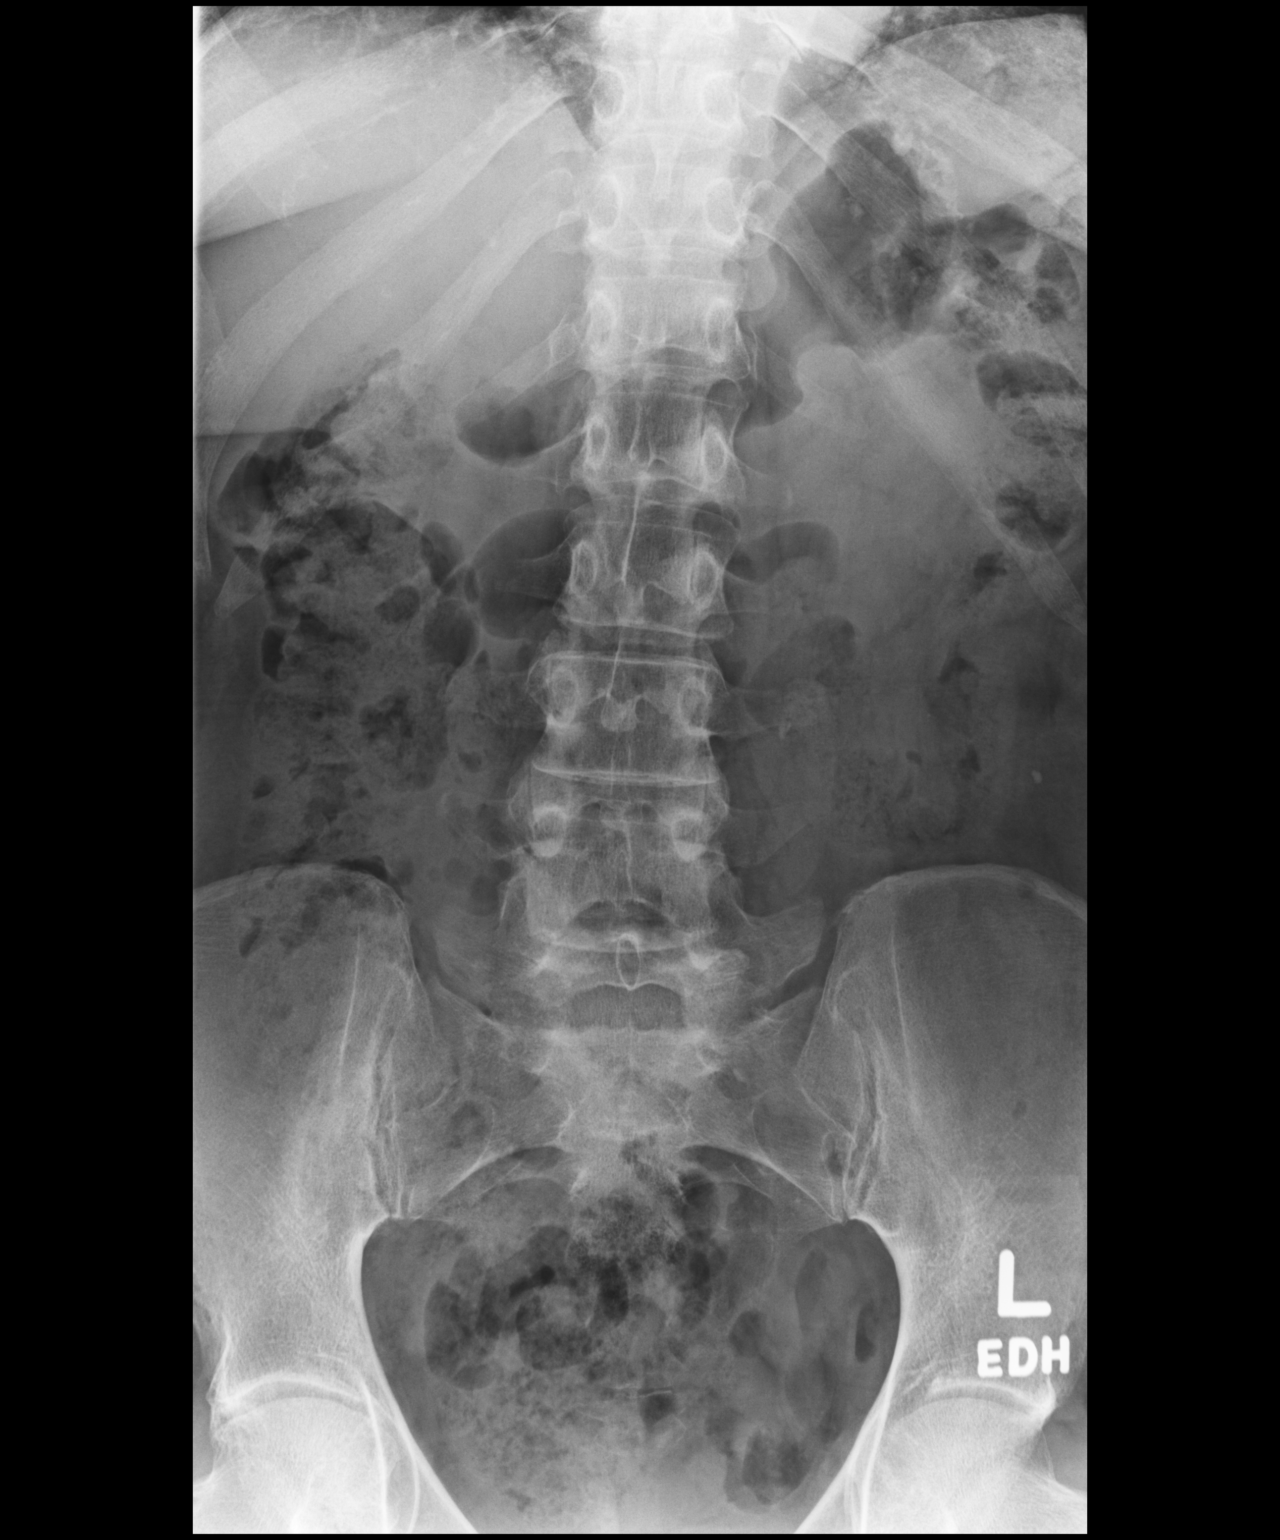

[dg lumbar spine 2-3 views (2 of 3)]
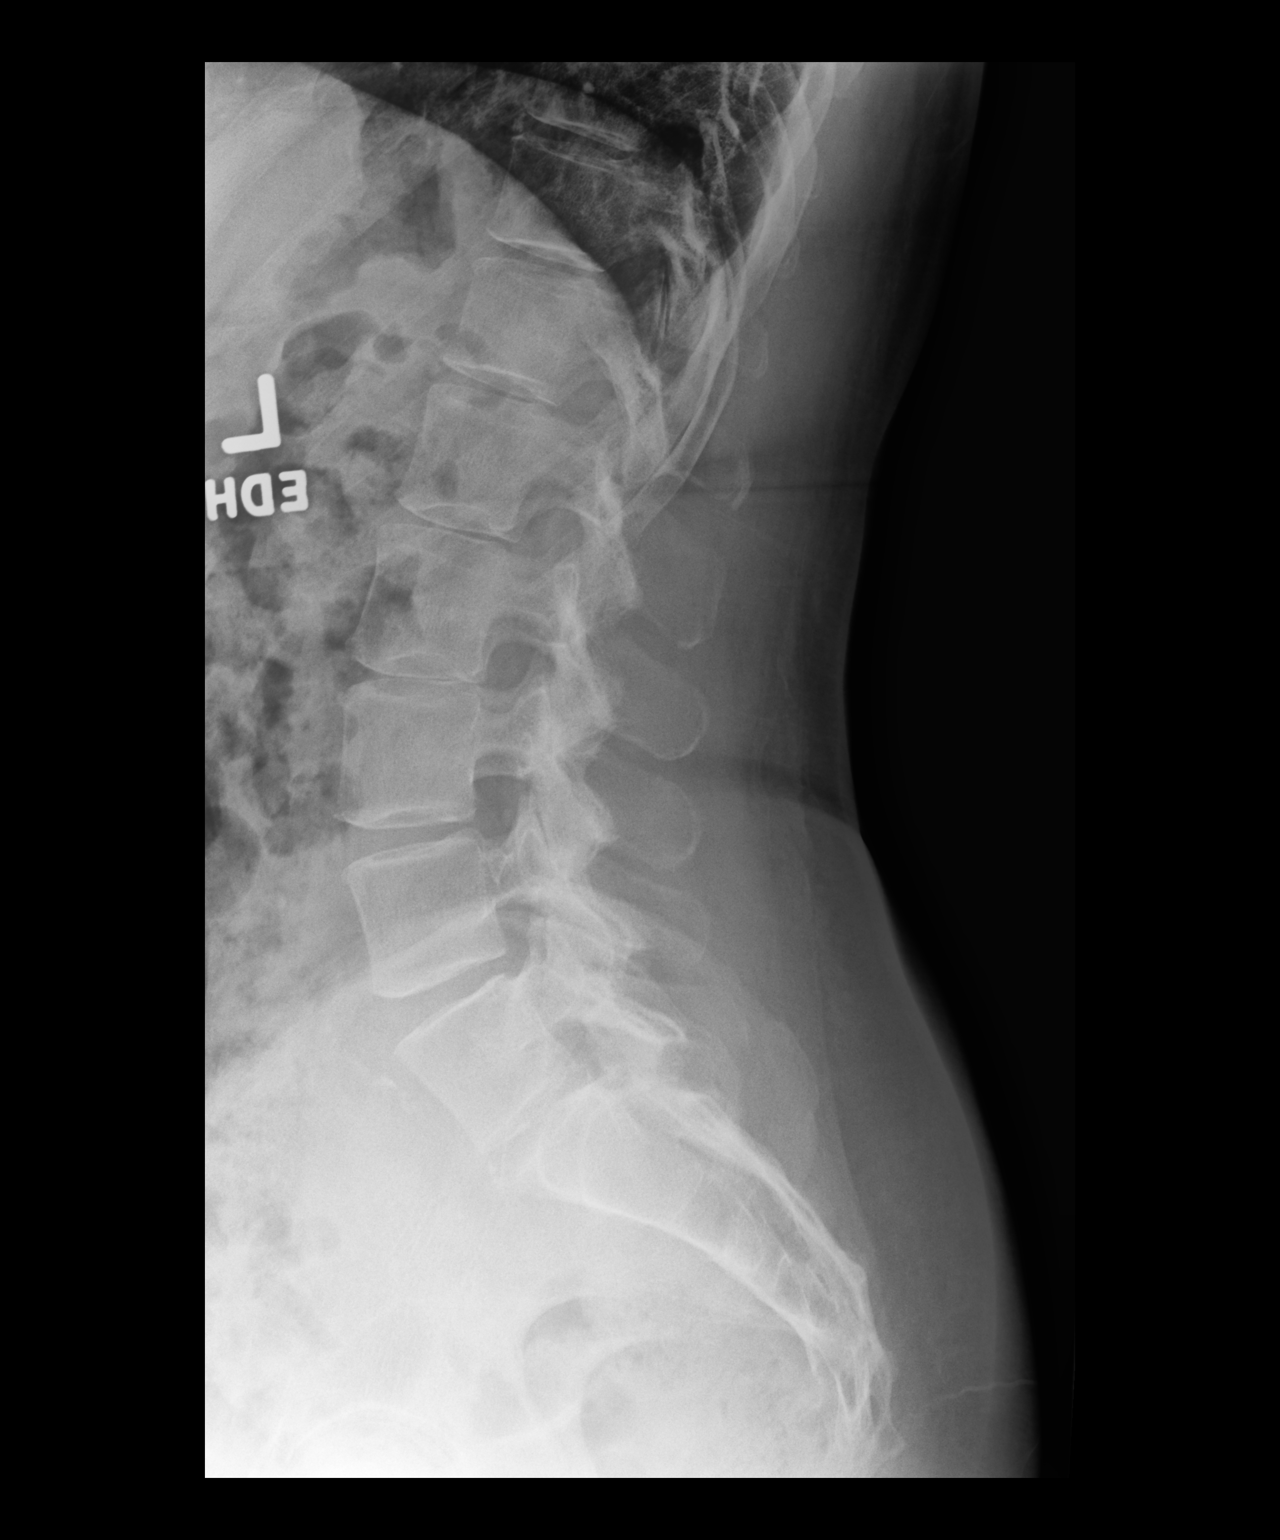

[dg lumbar spine 2-3 views (3 of 3)]
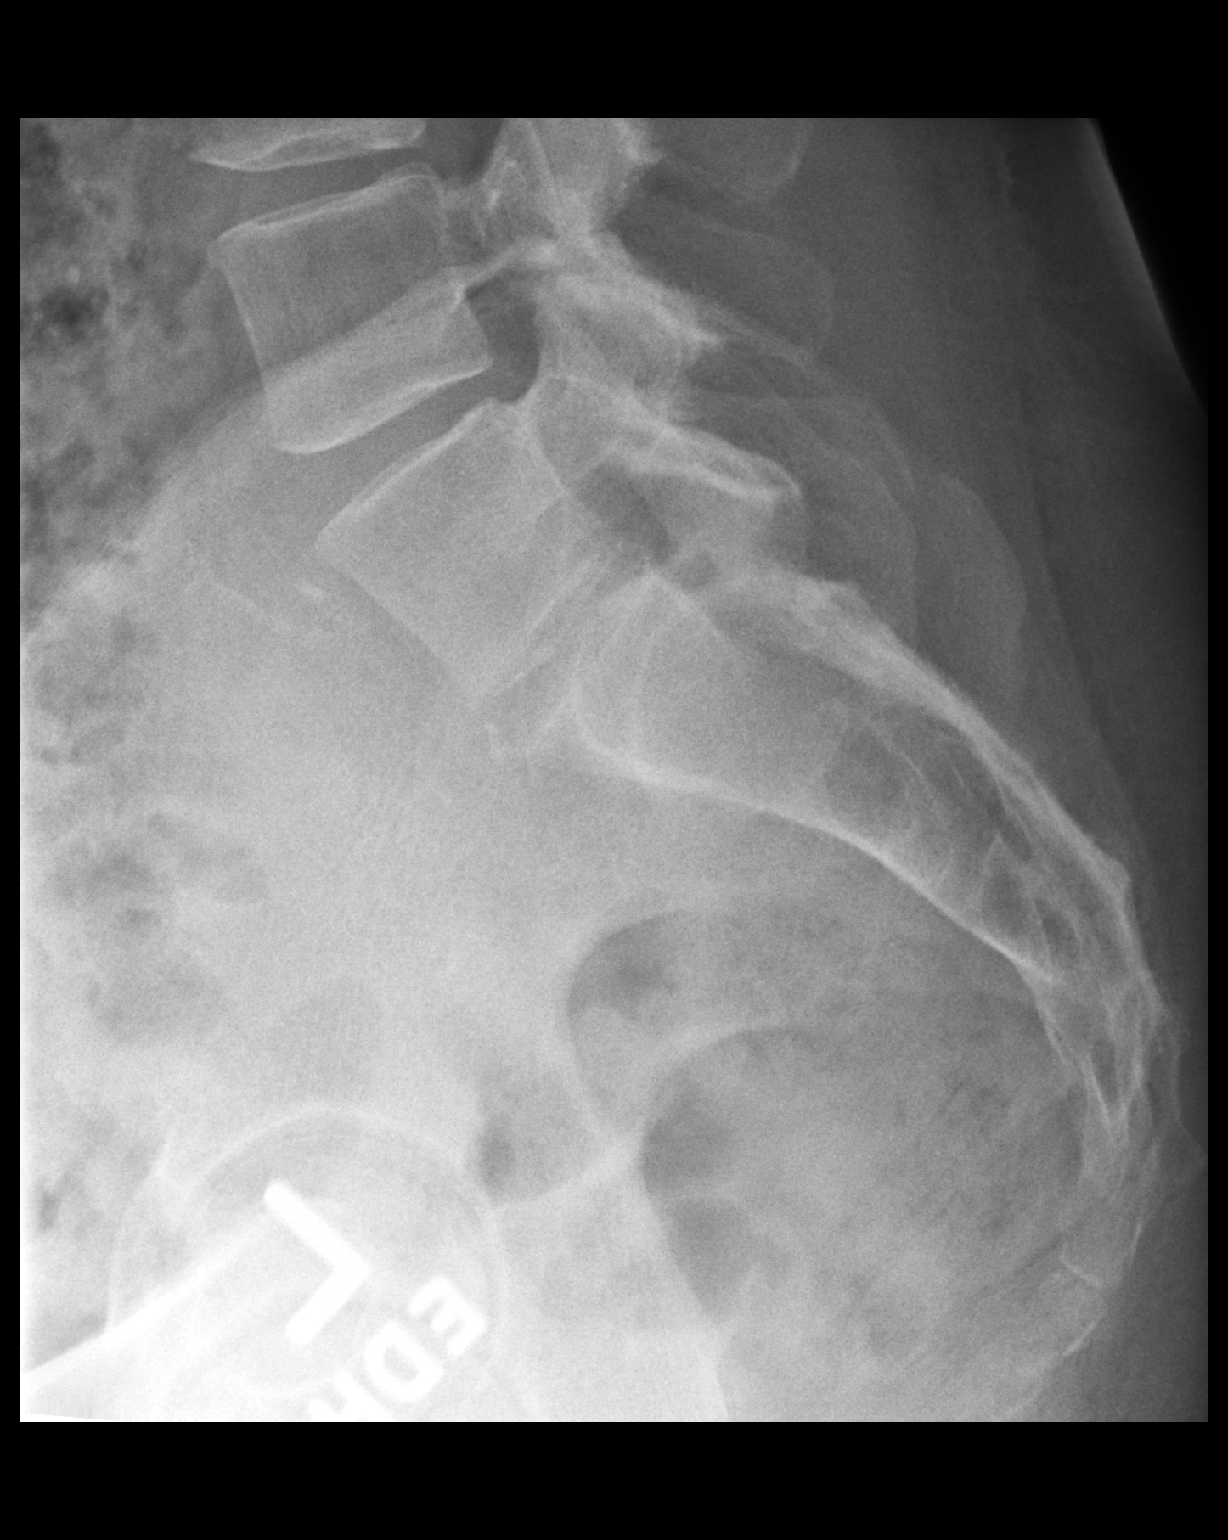

[3 of 3 positions shown; findings below may reference images not displayed]

FINDINGS: There is no evidence of lumbar spine fracture. Alignment is normal.
Intervertebral disc spaces are maintained.
IMPRESSION: Negative.

## 2022-04-16 ENCOUNTER — Emergency Department (HOSPITAL_BASED_OUTPATIENT_CLINIC_OR_DEPARTMENT_OTHER)
Admission: EM | Admit: 2022-04-16 | Discharge: 2022-04-16 | Disposition: A | Discharge status: Home / Self Care | Payer: Medicare HMO | Attending: Emergency Medicine | Admitting: Emergency Medicine

## 2022-04-16 ENCOUNTER — Other Ambulatory Visit: Payer: Self-pay

## 2022-04-16 ENCOUNTER — Emergency Department (HOSPITAL_BASED_OUTPATIENT_CLINIC_OR_DEPARTMENT_OTHER): Payer: Medicare HMO

## 2022-04-16 ENCOUNTER — Encounter (HOSPITAL_BASED_OUTPATIENT_CLINIC_OR_DEPARTMENT_OTHER): Payer: Self-pay | Admitting: Emergency Medicine

## 2022-04-16 DIAGNOSIS — G9389 Other specified disorders of brain: Secondary | ICD-10-CM | POA: Insufficient documentation

## 2022-04-16 DIAGNOSIS — S025XXA Fracture of tooth (traumatic), initial encounter for closed fracture: Secondary | ICD-10-CM | POA: Insufficient documentation

## 2022-04-16 DIAGNOSIS — Z23 Encounter for immunization: Secondary | ICD-10-CM | POA: Insufficient documentation

## 2022-04-16 DIAGNOSIS — Y92009 Unspecified place in unspecified non-institutional (private) residence as the place of occurrence of the external cause: Secondary | ICD-10-CM | POA: Diagnosis not present

## 2022-04-16 DIAGNOSIS — S0181XA Laceration without foreign body of other part of head, initial encounter: Secondary | ICD-10-CM

## 2022-04-16 DIAGNOSIS — W109XXA Fall (on) (from) unspecified stairs and steps, initial encounter: Secondary | ICD-10-CM | POA: Diagnosis not present

## 2022-04-16 DIAGNOSIS — R220 Localized swelling, mass and lump, head: Secondary | ICD-10-CM | POA: Diagnosis not present

## 2022-04-16 DIAGNOSIS — S01112A Laceration without foreign body of left eyelid and periocular area, initial encounter: Secondary | ICD-10-CM | POA: Insufficient documentation

## 2022-04-16 DIAGNOSIS — S0101XA Laceration without foreign body of scalp, initial encounter: Secondary | ICD-10-CM | POA: Diagnosis not present

## 2022-04-16 DIAGNOSIS — W19XXXA Unspecified fall, initial encounter: Secondary | ICD-10-CM

## 2022-04-16 DIAGNOSIS — S0083XA Contusion of other part of head, initial encounter: Secondary | ICD-10-CM | POA: Diagnosis not present

## 2022-04-16 DIAGNOSIS — S199XXA Unspecified injury of neck, initial encounter: Secondary | ICD-10-CM | POA: Diagnosis not present

## 2022-04-16 DIAGNOSIS — G939 Disorder of brain, unspecified: Secondary | ICD-10-CM | POA: Diagnosis not present

## 2022-04-16 MED ORDER — TETANUS-DIPHTH-ACELL PERTUSSIS 5-2.5-18.5 LF-MCG/0.5 IM SUSY
0.5000 mL | PREFILLED_SYRINGE | Freq: Once | INTRAMUSCULAR | Status: AC
  Administered 2022-04-16: 0.5 mL via INTRAMUSCULAR
  Filled 2022-04-16: qty 0.5

## 2022-04-16 NOTE — Discharge Instructions (Signed)
Your imaging today did not show any traumatic injuries however we discussed in the room there was an incidental finding of a mass.  This needs to be further looked at the MRI which we do not have here.  Please contact your primary care provider on Monday to have further imaging.  The Dermabond which is the liquid glue will stay in your forehead for approximately 5 days.  This will gently flake off.  May take Tylenol or Motrin as needed for pain.  Return for new or worsening symptoms.

## 2022-04-16 NOTE — ED Provider Notes (Signed)
[facility] DEPT Provider Note   CSN: [id number] Arrival date & time: 04/16/22  1827     History  Chief Complaint  Patient presents with   Cynthia Nunez is a 71 y.o. 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  Turned to walk out of the house and fell down 4 steps.  Chipped some front teeth with lip swelling, some swelling to left side of her head.  No vision changes.  She denies any syncope, LOC or anticoagulation.  No pain to back, chest, abdomen.  Ambulatory PTA.  No lightheadedness, dizziness. Unknown last tetanus.  HPI     Home Medications Prior to Admission medications   Medication Sig Start Date End Date Taking? Authorizing Provider  amLODipine (NORVASC) 5 MG tablet Take 1 tablet (5 mg total) by mouth daily. 03/27/20   Little Ishikawa, MD  Cholecalciferol (VITAMIN D) 50 MCG (2000 UT) tablet Take 2,000 Units by mouth daily.    [provider]  meloxicam (MOBIC) 15 MG tablet Take 15 mg by mouth daily. 06/23/21   [provider]  rosuvastatin (CRESTOR) 5 MG tablet Take 5 mg by mouth daily. 03/25/20   [provider]      Allergies    Sudafed [pseudoephedrine hcl], Fluconazole, Sulfamethizole, and Miralax [polyethylene glycol]    Review of Systems   Review of Systems  Constitutional: Negative.   HENT: Negative.    Respiratory: Negative.    Cardiovascular: Negative.   Gastrointestinal: Negative.   Genitourinary: Negative.   Musculoskeletal: Negative.   Skin:  Positive for wound.  Allergic/Immunologic: Negative.   All other systems reviewed and are negative.   Physical Exam Updated Vital Signs BP (!) 143/82   Pulse (!) 56   Temp 97.6 F (36.4 C)   Resp 18   SpO2 97%  Physical Exam Vitals and nursing note reviewed.  Constitutional:      General: She is not in acute distress.    Appearance: She is well-developed. She is not ill-appearing, toxic-appearing or diaphoretic.   HENT:     Head: Normocephalic. Abrasion, contusion and laceration present. No raccoon eyes, Battle's sign, masses, right periorbital erythema or left periorbital erythema.     Jaw: There is normal jaw occlusion.     Comments: Soft tissue swelling left lateral aspect face to right eyebrow and periorbital region.  No periorbital ecchymosis.  No crepitus or step-off.  Nontender mandible, maxilla.  No drooling, dysphagia or trismus.  Full range of motion to jaw without difficulty. 57m laceration left eyebrow, no active bleeding.    Nose: Nose normal.     Comments: Nontender mid nasal bridge. No septal hematoma    Mouth/Throat:     Lips: Pink.     Mouth: Mucous membranes are moist.     Pharynx: Uvula midline.      Comments: Soft tissue swelling, ecchymosis lower lip.  No laceration. Chipped front teeth Eyes:     Extraocular Movements: Extraocular movements intact.     Conjunctiva/sclera: Conjunctivae normal.     Pupils: Pupils are equal, round, and reactive to light.     Comments: PERRLA, Full ROM without pain. No traumatic hyphema   Neck:     Trachea: Trachea and phonation normal.     Comments: No midline tenderness, Full ROM Cardiovascular:     Rate and Rhythm: Normal rate.     Pulses: Normal pulses.  Radial pulses are 2+ on the right side and 2+ on the left side.     Heart sounds: Normal heart sounds.  Pulmonary:     Effort: Pulmonary effort is normal. No respiratory distress.     Breath sounds: Normal breath sounds and air entry.  Chest:     Comments: Non tender, no crepitus Abdominal:     General: There is no distension.     Palpations: Abdomen is soft.     Tenderness: There is no abdominal tenderness.  Musculoskeletal:        General: Normal range of motion.     Cervical back: Full passive range of motion without pain and normal range of motion.     Comments: No bony tenderness upper and lower extremities.  No midline C/T/L tenderness, full range of motion without  difficulty  Skin:    General: Skin is warm and dry.     Capillary Refill: Capillary refill takes less than 2 seconds.     Comments: 4 mm per facial laceration left eyebrow, no active bleeding.  Ecchymosis lower lip without laceration  Neurological:     General: No focal deficit present.     Mental Status: She is alert.  Psychiatric:        Mood and Affect: Mood normal.     ED Results / Procedures / Treatments   Labs (all labs ordered are listed, but only abnormal results are displayed) Labs Reviewed - No data to display  EKG None  Radiology CT Head Wo Contrast  Result Date: 04/16/2022 CLINICAL DATA:  Facial trauma, blunt; Neck trauma (Age >= 65y). Fall, facial laceration, EXAM: CT HEAD WITHOUT CONTRAST CT MAXILLOFACIAL WITHOUT CONTRAST CT CERVICAL SPINE WITHOUT CONTRAST TECHNIQUE: Multidetector CT imaging of the head, cervical spine, and maxillofacial structures were performed using the standard protocol without intravenous contrast. Multiplanar CT image reconstructions of the cervical spine and maxillofacial structures were also generated. RADIATION DOSE REDUCTION: This exam was performed according to the departmental dose-optimization program which includes automated exposure control, adjustment of the mA and/or kV according to patient size and/or use of iterative reconstruction technique. COMPARISON:  None Available. FINDINGS: CT HEAD FINDINGS Brain: Normal anatomic configuration of the brain. A bilobed 14 x 22 mm mass is seen within the left cavernous sinus extending into the left sella demonstrating osseous remodeling. Differential considerations include a vascular aneurysm, nerve sheath tumor, or meningioma. No other intra or extra-axial mass lesion. No evidence acute intracranial hemorrhage or infarct. No abnormal mass effect midline shift. Ventricular size is normal. Cerebellum is unremarkable. Vascular: No asymmetric hyperdense vasculature at the skull base. Skull: Normal. Negative  for fracture or focal lesion. Other: Mastoid air cells and middle ear cavities are clear. CT MAXILLOFACIAL FINDINGS Osseous: The crown of the tooth # 8 appears fractured no other facial fracture identified. No mandibular dislocation. Orbits: Moderate left preseptal soft tissue swelling. The ocular globes are intact and the ocular lenses are ortho topically position. No retro-orbital fluid collections or inflammatory change identified. Sinuses: Clear. Soft tissues: Mild subcutaneous edema of the left infraorbital soft tissues. Mild subcutaneous edema superficial to the mandibular mentum. CT CERVICAL SPINE FINDINGS Alignment: Normal. Skull base and vertebrae: Craniocervical alignment is normal. Atlantodental interval is not widened. No acute fracture of the cervical spine. Vertebral body height is preserved. Soft tissues and spinal canal: No prevertebral fluid or swelling. No visible canal hematoma. Disc levels: There is intervertebral disc space narrowing and endplate remodeling of H5-K5 in keeping with changes of  moderate degenerative disc disease. Prevertebral soft tissues are not thickened on sagittal reformats. Spinal canal is widely patent. Multilevel uncovertebral and facet arthrosis results in severe left neuroforaminal narrowing at C3-4 and moderate to severe in left neuroforaminal narrowing at C4-5. Remaining neural foramina are widely patent. Upper chest: Negative. Other: None IMPRESSION: 1. No acute intracranial abnormality. No calvarial fracture. 2. 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 Differential considerations include a vascular aneurysm, nerve sheath tumor, or meningioma. This would be better assessed with MRI examination. 3. Moderate left preseptal soft tissue swelling. Mild subcutaneous edema of the left infraorbital soft tissues and superficial to the mandibular mentum. 4. Fracture of the crown of the tooth # 8. No other  facial fracture identified. 5. No acute fracture or listhesis of the cervical spine. 6. Multilevel degenerative disc and degenerative joint disease resulting in severe left neuroforaminal narrowing at C3-4 and moderate to severe left neuroforaminal narrowing at C4-5. Electronically Signed   By: Fidela Salisbury M.D.   On: 04/16/2022 19:40   CT Maxillofacial Wo Contrast  Result Date: 04/16/2022 CLINICAL DATA:  Facial trauma, blunt; Neck trauma (Age >= 65y). Fall, facial laceration, EXAM: CT HEAD WITHOUT CONTRAST CT MAXILLOFACIAL WITHOUT CONTRAST CT CERVICAL SPINE WITHOUT CONTRAST TECHNIQUE: Multidetector CT imaging of the head, cervical spine, and maxillofacial structures were performed using the standard protocol without intravenous contrast. Multiplanar CT image reconstructions of the cervical spine and maxillofacial structures were also generated. RADIATION DOSE REDUCTION: This exam was performed according to the departmental dose-optimization program which includes automated exposure control, adjustment of the mA and/or kV according to patient size and/or use of iterative reconstruction technique. COMPARISON:  None Available. FINDINGS: CT HEAD FINDINGS Brain: Normal anatomic configuration of the brain. A bilobed 14 x 22 mm mass is seen within the left cavernous sinus extending into the left sella demonstrating osseous remodeling. Differential considerations include a vascular aneurysm, nerve sheath tumor, or meningioma. No other intra or extra-axial mass lesion. No evidence acute intracranial hemorrhage or infarct. No abnormal mass effect midline shift. Ventricular size is normal. Cerebellum is unremarkable. Vascular: No asymmetric hyperdense vasculature at the skull base. Skull: Normal. Negative for fracture or focal lesion. Other: Mastoid air cells and middle ear cavities are clear. CT MAXILLOFACIAL FINDINGS Osseous: The crown of the tooth # 8 appears fractured no other facial fracture identified. No  mandibular dislocation. Orbits: Moderate left preseptal soft tissue swelling. The ocular globes are intact and the ocular lenses are ortho topically position. No retro-orbital fluid collections or inflammatory change identified. Sinuses: Clear. Soft tissues: Mild subcutaneous edema of the left infraorbital soft tissues. Mild subcutaneous edema superficial to the mandibular mentum. CT CERVICAL SPINE FINDINGS Alignment: Normal. Skull base and vertebrae: Craniocervical alignment is normal. Atlantodental interval is not widened. No acute fracture of the cervical spine. Vertebral body height is preserved. Soft tissues and spinal canal: No prevertebral fluid or swelling. No visible canal hematoma. Disc levels: There is intervertebral disc space narrowing and endplate remodeling of Y6-A6 in keeping with changes of moderate degenerative disc disease. Prevertebral soft tissues are not thickened on sagittal reformats. Spinal canal is widely patent. Multilevel uncovertebral and facet arthrosis results in severe left neuroforaminal narrowing at C3-4 and moderate to severe in left neuroforaminal narrowing at C4-5. Remaining neural foramina are widely patent. Upper chest: Negative. Other: None IMPRESSION: 1. No acute intracranial abnormality. No calvarial fracture. 2. 22 mm bilobed mass within the left cavernous sinus extending  into the left sella demonstrating osseous remodeling. Differential considerations include a vascular aneurysm, nerve sheath tumor, or meningioma. This would be better assessed with MRI examination. 3. Moderate left preseptal soft tissue swelling. Mild subcutaneous edema of the left infraorbital soft tissues and superficial to the mandibular mentum. 4. Fracture of the crown of the tooth # 8. No other facial fracture identified. 5. No acute fracture or listhesis of the cervical spine. 6. Multilevel degenerative disc and degenerative joint disease resulting in severe left neuroforaminal narrowing at C3-4 and  moderate to severe left neuroforaminal narrowing at C4-5. Electronically Signed   By: Fidela Salisbury M.D.   On: 04/16/2022 19:40   CT Cervical Spine Wo Contrast  Result Date: 04/16/2022 CLINICAL DATA:  Facial trauma, blunt; Neck trauma (Age >= 65y). Fall, facial laceration, EXAM: CT HEAD WITHOUT CONTRAST CT MAXILLOFACIAL WITHOUT CONTRAST CT CERVICAL SPINE WITHOUT CONTRAST TECHNIQUE: Multidetector CT imaging of the head, cervical spine, and maxillofacial structures were performed using the standard protocol without intravenous contrast. Multiplanar CT image reconstructions of the cervical spine and maxillofacial structures were also generated. RADIATION DOSE REDUCTION: This exam was performed according to the departmental dose-optimization program which includes automated exposure control, adjustment of the mA and/or kV according to patient size and/or use of iterative reconstruction technique. COMPARISON:  None Available. FINDINGS: CT HEAD FINDINGS Brain: Normal anatomic configuration of the brain. A bilobed 14 x 22 mm mass is seen within the left cavernous sinus extending into the left sella demonstrating osseous remodeling. Differential considerations include a vascular aneurysm, nerve sheath tumor, or meningioma. No other intra or extra-axial mass lesion. No evidence acute intracranial hemorrhage or infarct. No abnormal mass effect midline shift. Ventricular size is normal. Cerebellum is unremarkable. Vascular: No asymmetric hyperdense vasculature at the skull base. Skull: Normal. Negative for fracture or focal lesion. Other: Mastoid air cells and middle ear cavities are clear. CT MAXILLOFACIAL FINDINGS Osseous: The crown of the tooth # 8 appears fractured no other facial fracture identified. No mandibular dislocation. Orbits: Moderate left preseptal soft tissue swelling. The ocular globes are intact and the ocular lenses are ortho topically position. No retro-orbital fluid collections or inflammatory change  identified. Sinuses: Clear. Soft tissues: Mild subcutaneous edema of the left infraorbital soft tissues. Mild subcutaneous edema superficial to the mandibular mentum. CT CERVICAL SPINE FINDINGS Alignment: Normal. Skull base and vertebrae: Craniocervical alignment is normal. Atlantodental interval is not widened. No acute fracture of the cervical spine. Vertebral body height is preserved. Soft tissues and spinal canal: No prevertebral fluid or swelling. No visible canal hematoma. Disc levels: There is intervertebral disc space narrowing and endplate remodeling of Z1-I4 in keeping with changes of moderate degenerative disc disease. Prevertebral soft tissues are not thickened on sagittal reformats. Spinal canal is widely patent. Multilevel uncovertebral and facet arthrosis results in severe left neuroforaminal narrowing at C3-4 and moderate to severe in left neuroforaminal narrowing at C4-5. Remaining neural foramina are widely patent. Upper chest: Negative. Other: None IMPRESSION: 1. No acute intracranial abnormality. No calvarial fracture. 2. 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 Differential considerations include a vascular aneurysm, nerve sheath tumor, or meningioma. This would be better assessed with MRI examination. 3. Moderate left preseptal soft tissue swelling. Mild subcutaneous edema of the left infraorbital soft tissues and superficial to the mandibular mentum. 4. Fracture of the crown of the tooth # 8. No other facial fracture identified. 5. No acute fracture or listhesis of the  cervical spine. 6. Multilevel degenerative disc and degenerative joint disease resulting in severe left neuroforaminal narrowing at C3-4 and moderate to severe left neuroforaminal narrowing at C4-5. Electronically Signed   By: Fidela Salisbury M.D.   On: 04/16/2022 19:40    Procedures .Marland KitchenLaceration Repair  Date/Time: 04/16/2022 7:58 PM  Performed by:  Shelby Dubin A, PA-C Authorized by: Nettie Elm, PA-C   Consent:    Consent obtained:  Verbal   Consent given by:  Patient   Risks, benefits, and alternatives were discussed: yes     Risks discussed:  Infection, pain, retained foreign body, tendon damage, vascular damage, poor wound healing, need for additional repair, nerve damage and poor cosmetic result   Alternatives discussed:  No treatment, delayed treatment, observation and referral Universal protocol:    Procedure explained and questions answered to patient or proxy's satisfaction: yes     Relevant documents present and verified: yes     Test results available: yes     Imaging studies available: yes     Required blood products, implants, devices, and special equipment available: yes     Site/side marked: yes     Immediately prior to procedure, a time out was called: yes     Patient identity confirmed:  Verbally with patient Laceration details:    Location:  Face   Face location:  L eyebrow   Length (cm):  0.4   Depth (mm):  2 Pre-procedure details:    Preparation:  Patient was prepped and draped in usual sterile fashion and imaging obtained to evaluate for foreign bodies Exploration:    Hemostasis achieved with:  Direct pressure   Imaging obtained comment:  CT   Wound extent: no foreign body, no signs of injury, no nerve damage, no tendon damage, no underlying fracture and no vascular damage     Contaminated: no   Treatment:    Area cleansed with:  Povidone-iodine   Amount of cleaning:  Extensive   Irrigation solution:  Sterile saline   Visualized foreign bodies/material removed: no   Skin repair:    Repair method:  Tissue adhesive Approximation:    Approximation:  Close Repair type:    Repair type:  Simple Post-procedure details:    Dressing:  Open (no dressing)   Procedure completion:  Tolerated well, no immediate complications     Medications Ordered in ED Medications  Tdap (BOOSTRIX) injection  0.5 mL (0.5 mLs Intramuscular Given 04/16/22 1910)    ED Course/ Medical Decision Making/ A&P    71 year old here for evaluation mechanical fall PTA.  No midline C/T/L tenderness.  No evidence of traumatic eye injury.  She has 4 mm laceration to left eyebrow without active bleeding.  Unknown last tetanus, will update given laceration.  Will plan on Dermabond the area.  She is some tenderness soft tissue swelling to superior inferior aspect left eye without large raccoon eyes.  No Battle sign.  No midline C/T/L tenderness.  Nontender bilateral upper and lower extremities, full range of motion without difficulty.  She is ambulatory PTA.  Denies any prior syncope prior to the fall.  Plan on imaging.  She does not want anything for pain at this time  Imaging personally viewed and interpreted:  CT head with no acute intracranial abnormality however incidental 2 mm bilobed mass left cavernous sinus extending to the left sella with osseous remodeling, differential include vascular aneurysm, nerve sheath tumor meningioma CT max face w moderate left preseptal soft tissue swelling, subcutaneous edema left  infraorbital soft tissue's.  Fractured crown of tooth #8 CT cervical with degenerative changes, no acute abnormality  Reassessed.  We discussed her imaging.  She was given a copy of the CT findings.  This is likely an incidental finding.  She has had no syncope.  She has a nonfocal neuroexam without deficits.  I discussed with my attending, Dr. Pearline Cables.  He has reviewed the CT report.  Recommends follow-up outpatient for MRI.  I discussed this follow-up with patient, family in room.  They are agreeable.  See procedure note.  Dermabond without difficulty.  She will follow-up outpatient for findings, she has any new or worsening symptoms she will go to [facility] or [facility] for [facility]  The patient has been appropriately medically screened and/or stabilized in the ED. I have low suspicion for any other emergent  medical condition which would require further screening, evaluation or treatment in the ED or require inpatient management.  Patient is hemodynamically stable and in no acute distress.  Patient able to ambulate in department prior to ED.  Evaluation does not show acute pathology that would require ongoing or additional emergent interventions while in the emergency department or further inpatient treatment.  I have discussed the diagnosis with the patient and answered all questions.  Pain is been managed while in the emergency department and patient has no further complaints prior to discharge.  Patient is comfortable with plan discussed in room and is stable for discharge at this time.  I have discussed strict return precautions for returning to the emergency department.  Patient was encouraged to follow-up with PCP/specialist refer to at discharge.                            Medical Decision Making Amount and/or Complexity of Data Reviewed Independent Historian: spouse External Data Reviewed: radiology and notes. Radiology: ordered and independent interpretation performed. Decision-making details documented in ED Course.  Risk OTC drugs. Prescription drug management. Decision regarding hospitalization. Diagnosis or treatment significantly limited by social determinants of health.          Final Clinical Impression(s) / ED Diagnoses Final diagnoses:  Fall, initial encounter  Facial laceration, initial encounter  Mass, brain    Rx / DC Orders ED Discharge Orders     None         Jacqulin Brandenburger A, PA-C 04/16/22 2001    Jeanell Sparrow, DO 04/17/22 2024

## 2022-04-16 NOTE — ED Triage Notes (Signed)
Around 3:30-4pm Fall onto bricks.down 4 steps Hit left side of face, lac above eyebrow Some bruising and mild swelling  Chipped front tooth and to bottom lip Denies loc or other injury

## 2022-04-21 ENCOUNTER — Other Ambulatory Visit: Payer: Self-pay | Admitting: Internal Medicine

## 2022-04-21 DIAGNOSIS — R9089 Other abnormal findings on diagnostic imaging of central nervous system: Secondary | ICD-10-CM

## 2022-04-22 ENCOUNTER — Ambulatory Visit
Admission: RE | Admit: 2022-04-22 | Discharge: 2022-04-22 | Disposition: A | Discharge status: Home / Self Care | Payer: Medicare HMO | Source: Ambulatory Visit | Attending: Internal Medicine | Admitting: Internal Medicine

## 2022-04-22 DIAGNOSIS — R930 Abnormal findings on diagnostic imaging of skull and head, not elsewhere classified: Secondary | ICD-10-CM | POA: Diagnosis not present

## 2022-04-22 DIAGNOSIS — S0990XA Unspecified injury of head, initial encounter: Secondary | ICD-10-CM | POA: Diagnosis not present

## 2022-04-22 DIAGNOSIS — R9089 Other abnormal findings on diagnostic imaging of central nervous system: Secondary | ICD-10-CM

## 2022-04-22 DIAGNOSIS — I6782 Cerebral ischemia: Secondary | ICD-10-CM | POA: Diagnosis not present

## 2022-06-03 DIAGNOSIS — Z1231 Encounter for screening mammogram for malignant neoplasm of breast: Secondary | ICD-10-CM | POA: Diagnosis not present

## 2022-07-05 ENCOUNTER — Ambulatory Visit (INDEPENDENT_AMBULATORY_CARE_PROVIDER_SITE_OTHER): Payer: Medicare HMO | Admitting: Obstetrics & Gynecology

## 2022-07-05 ENCOUNTER — Encounter: Payer: Self-pay | Admitting: Obstetrics & Gynecology

## 2022-07-05 VITALS — BP 132/88 | HR 77 | Ht 65.0 in | Wt 157.0 lb

## 2022-07-05 DIAGNOSIS — Z01419 Encounter for gynecological examination (general) (routine) without abnormal findings: Secondary | ICD-10-CM | POA: Diagnosis not present

## 2022-07-05 DIAGNOSIS — Z780 Asymptomatic menopausal state: Secondary | ICD-10-CM

## 2022-07-05 DIAGNOSIS — Z90710 Acquired absence of both cervix and uterus: Secondary | ICD-10-CM | POA: Diagnosis not present

## 2022-07-05 DIAGNOSIS — R39198 Other difficulties with micturition: Secondary | ICD-10-CM | POA: Diagnosis not present

## 2022-07-05 NOTE — Addendum Note (Signed)
Addended by: Derrill Memo on: 07/05/2022 09:58 AM   Modules accepted: Orders

## 2022-07-05 NOTE — Progress Notes (Addendum)
Cynthia Nunez [birth year] ER:[id number]   History:    72 y.o. G0 Married   RP:  Established patient presenting for annual gyn exam    HPI: Postmenopausal, well on no HRT.  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  Follow-up CT scan was negative. GYN oncology did not recommend any further follow-up. Abstinent.  Pap smear Neg in 06/2021.  History of anal intraepithelial neoplasia grade 1 in 2013. Breasts normal. Mammogram 05/2022 Negative. Good BMI at 26.13. Continue with fitness, goes to Gym at 5:30 am everyday. Colonoscopy 2023. DEXA 07/2018  normal.  Next DEXA recommended in 2025-6. Health labs with Fam MD.  Declines Flu vaccine.   Past medical history,surgical history, family history and social history were all reviewed and documented in the EPIC chart.  Gynecologic History No LMP recorded. Patient has had a hysterectomy.  Obstetric History OB History  Gravida Para Term Preterm AB Living  0 0 0 0 0 0  SAB IAB Ectopic Multiple Live Births  0 0 0 0 0     ROS: A ROS was performed and pertinent positives and negatives are included in the history. GENERAL: No fevers or chills. HEENT: No change in vision, no earache, sore throat or sinus congestion. NECK: No pain or stiffness. CARDIOVASCULAR: No chest pain or pressure. No palpitations. PULMONARY: No shortness of breath, cough or wheeze. GASTROINTESTINAL: No abdominal pain, nausea, vomiting or diarrhea, melena or bright red blood per rectum. GENITOURINARY: No urinary frequency, urgency, hesitancy or dysuria. MUSCULOSKELETAL: No joint or muscle pain, no back pain, no recent trauma. DERMATOLOGIC: No rash, no itching, no lesions. ENDOCRINE: No polyuria, polydipsia, no heat or cold intolerance. No recent change in weight. HEMATOLOGICAL: No anemia or easy bruising or bleeding. NEUROLOGIC: No headache, seizures, numbness, tingling or weakness. PSYCHIATRIC: No depression, no loss  of interest in normal activity or change in sleep pattern.     Exam:   BP 132/88 (BP Location: Right Arm, Patient Position: Sitting, Cuff Size: Normal)   Pulse 77   Ht '5\' 5"'$  (1.651 m)   Wt 157 lb (71.2 kg)   SpO2 96%   BMI 26.13 kg/m   Body mass index is 26.13 kg/m.  General appearance : Well developed well nourished female. No acute distress HEENT: Eyes: no retinal hemorrhage or exudates,  Neck supple, trachea midline, no carotid bruits, no thyroidmegaly Lungs: Clear to auscultation, no rhonchi or wheezes, or rib retractions  Heart: Regular rate and rhythm, no murmurs or gallops Breast:Examined in sitting and supine position were symmetrical in appearance, no palpable masses or tenderness,  no skin retraction, no nipple inversion, no nipple discharge, no skin discoloration, no axillary or supraclavicular lymphadenopathy Abdomen: no palpable masses or tenderness, no rebound or guarding Extremities: no edema or skin discoloration or tenderness  Pelvic: Vulva: Normal             Vagina: No gross lesions or discharge  Cervix: No gross lesions or discharge  Uterus  AV, normal size, shape and consistency, non-tender and mobile  Adnexa  Without masses or tenderness  Anus: Normal  U/A: Light yellow, clear, Pro Neg, Nit Neg, WBC 0-5, RBC 0-2, Bacteria Few.  U. Culture pending.  Assessment/Plan:  72 y.o. female for annual exam   1. Well female exam with routine gynecological exam Postmenopausal, well on no HRT.  History of TVH. Separately had a BSO for stromal carcinoid tumor 4.8  cm that was confined to the ovary per the record.  Follow-up CT scan was negative. GYN oncology did not recommend any further follow-up. Abstinent.  Pap smear Neg in 06/2021.  History of anal intraepithelial neoplasia grade 1 in 2013. Breasts normal. Mammogram 05/2022 Negative. Good BMI at 26.13. Continue with fitness, goes to Gym at 5:30 am everyday. Colonoscopy 2023. DEXA 07/2018  normal.  Next DEXA recommended in  2025-6. Health labs with Fam MD.  Declines Flu vaccine.  2. S/P total hysterectomy with BSO  3. Postmenopause  ostmenopausal, well on no HRT.  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  Follow-up CT scan was negative. GYN oncology did not recommend any further follow-up.  4. Difficulty urinating Very mild abnormalities on U/A, will wait on culture. - Urinalysis,Complete w/RFL Culture   Princess Bruins MD, 9:37 AM

## 2022-07-07 LAB — URINE CULTURE
MICRO NUMBER:: 14613530
SPECIMEN QUALITY:: ADEQUATE

## 2022-07-07 LAB — URINALYSIS, COMPLETE W/RFL CULTURE
Bilirubin Urine: NEGATIVE
Casts: NONE SEEN /LPF
Crystals: NONE SEEN /HPF
Glucose, UA: NEGATIVE
Hgb urine dipstick: NEGATIVE
Hyaline Cast: NONE SEEN /LPF
Ketones, ur: NEGATIVE
Nitrites, Initial: NEGATIVE
Protein, ur: NEGATIVE
Specific Gravity, Urine: 1.015 (ref 1.001–1.035)
Yeast: NONE SEEN /HPF
pH: 7 (ref 5.0–8.0)

## 2022-07-07 LAB — CULTURE INDICATED

## 2022-10-22 DIAGNOSIS — H43812 Vitreous degeneration, left eye: Secondary | ICD-10-CM | POA: Diagnosis not present

## 2022-11-01 DIAGNOSIS — H40013 Open angle with borderline findings, low risk, bilateral: Secondary | ICD-10-CM | POA: Diagnosis not present

## 2023-02-09 DIAGNOSIS — E7800 Pure hypercholesterolemia, unspecified: Secondary | ICD-10-CM | POA: Diagnosis not present

## 2023-02-09 DIAGNOSIS — I10 Essential (primary) hypertension: Secondary | ICD-10-CM | POA: Diagnosis not present

## 2023-02-09 DIAGNOSIS — Z5181 Encounter for therapeutic drug level monitoring: Secondary | ICD-10-CM | POA: Diagnosis not present

## 2023-02-15 ENCOUNTER — Telehealth: Payer: Self-pay | Admitting: Medical Oncology

## 2023-02-15 NOTE — Telephone Encounter (Signed)
I called Dr Polite's office and LVM that Cynthia Nunez that this is not one of our patients and we cannot release records.

## 2023-02-15 NOTE — Telephone Encounter (Signed)
Request to fax records to Dr Nehemiah Settle from 2009-present. Fax to [phone]

## 2023-03-17 DIAGNOSIS — R059 Cough, unspecified: Secondary | ICD-10-CM | POA: Diagnosis not present

## 2023-03-17 DIAGNOSIS — D696 Thrombocytopenia, unspecified: Secondary | ICD-10-CM | POA: Diagnosis not present

## 2023-04-25 DIAGNOSIS — H40013 Open angle with borderline findings, low risk, bilateral: Secondary | ICD-10-CM | POA: Diagnosis not present

## 2023-04-26 DIAGNOSIS — Z0100 Encounter for examination of eyes and vision without abnormal findings: Secondary | ICD-10-CM | POA: Diagnosis not present

## 2023-05-26 ENCOUNTER — Telehealth: Payer: Self-pay

## 2023-05-26 DIAGNOSIS — Z13820 Encounter for screening for osteoporosis: Secondary | ICD-10-CM

## 2023-05-26 NOTE — Telephone Encounter (Signed)
Order placed. To be followed up on via work queue. Pt notified via mychart msg. Encounter closed.

## 2023-05-26 NOTE — Telephone Encounter (Signed)
-----   Message from [place] sent at 05/26/2023  2:25 PM EST ----- Regarding: DEXA order needed Need order for DEXA to [facility]WB

## 2023-05-26 NOTE — Telephone Encounter (Signed)
Ok to order 

## 2023-05-26 NOTE — Telephone Encounter (Signed)
L B&P 07/05/2022-ML (LR MCR) L DEXA 07/26/2018  Ok to place order?

## 2023-05-28 ENCOUNTER — Emergency Department (HOSPITAL_BASED_OUTPATIENT_CLINIC_OR_DEPARTMENT_OTHER)
Admission: EM | Admit: 2023-05-28 | Discharge: 2023-05-28 | Disposition: A | Discharge status: Home / Self Care | Payer: Medicare HMO | Attending: Emergency Medicine | Admitting: Emergency Medicine

## 2023-05-28 ENCOUNTER — Encounter (HOSPITAL_BASED_OUTPATIENT_CLINIC_OR_DEPARTMENT_OTHER): Payer: Self-pay

## 2023-05-28 ENCOUNTER — Other Ambulatory Visit: Payer: Self-pay

## 2023-05-28 DIAGNOSIS — N939 Abnormal uterine and vaginal bleeding, unspecified: Secondary | ICD-10-CM | POA: Diagnosis not present

## 2023-05-28 DIAGNOSIS — Z79899 Other long term (current) drug therapy: Secondary | ICD-10-CM | POA: Diagnosis not present

## 2023-05-28 LAB — CBC WITH DIFFERENTIAL/PLATELET
Abs Immature Granulocytes: 0.07 10*3/uL (ref 0.00–0.07)
Basophils Absolute: 0.1 10*3/uL (ref 0.0–0.1)
Basophils Relative: 1 %
Eosinophils Absolute: 0.1 10*3/uL (ref 0.0–0.5)
Eosinophils Relative: 1 %
HCT: 40.7 % (ref 36.0–46.0)
Hemoglobin: 13.3 g/dL (ref 12.0–15.0)
Immature Granulocytes: 1 %
Lymphocytes Relative: 41 %
Lymphs Abs: 4.1 10*3/uL — ABNORMAL HIGH (ref 0.7–4.0)
MCH: 25.6 pg — ABNORMAL LOW (ref 26.0–34.0)
MCHC: 32.7 g/dL (ref 30.0–36.0)
MCV: 78.4 fL — ABNORMAL LOW (ref 80.0–100.0)
Monocytes Absolute: 0.7 10*3/uL (ref 0.1–1.0)
Monocytes Relative: 7 %
Neutro Abs: 5 10*3/uL (ref 1.7–7.7)
Neutrophils Relative %: 49 %
Platelets: 105 10*3/uL — ABNORMAL LOW (ref 150–400)
RBC: 5.19 MIL/uL — ABNORMAL HIGH (ref 3.87–5.11)
RDW: 14.6 % (ref 11.5–15.5)
WBC: 10 10*3/uL (ref 4.0–10.5)
nRBC: 0 % (ref 0.0–0.2)

## 2023-05-28 LAB — URINALYSIS, ROUTINE W REFLEX MICROSCOPIC
Bilirubin Urine: NEGATIVE
Glucose, UA: NEGATIVE mg/dL
Ketones, ur: NEGATIVE mg/dL
Nitrite: NEGATIVE
Protein, ur: NEGATIVE mg/dL
Specific Gravity, Urine: 1.015 (ref 1.005–1.030)
pH: 6.5 (ref 5.0–8.0)

## 2023-05-28 LAB — COMPREHENSIVE METABOLIC PANEL
ALT: 12 U/L (ref 0–44)
AST: 18 U/L (ref 15–41)
Albumin: 4.6 g/dL (ref 3.5–5.0)
Alkaline Phosphatase: 111 U/L (ref 38–126)
Anion gap: 9 (ref 5–15)
BUN: 21 mg/dL (ref 8–23)
CO2: 25 mmol/L (ref 22–32)
Calcium: 9.7 mg/dL (ref 8.9–10.3)
Chloride: 105 mmol/L (ref 98–111)
Creatinine, Ser: 1.04 mg/dL — ABNORMAL HIGH (ref 0.44–1.00)
GFR, Estimated: 58 mL/min — ABNORMAL LOW (ref >60)
Glucose, Bld: 93 mg/dL (ref 70–99)
Potassium: 4.2 mmol/L (ref 3.5–5.1)
Sodium: 139 mmol/L (ref 135–145)
Total Bilirubin: 0.3 mg/dL (ref 0.0–1.2)
Total Protein: 7.2 g/dL (ref 6.5–8.1)

## 2023-05-28 NOTE — ED Triage Notes (Signed)
Patient arrives with complaints of vaginal bleeding that started today. Patient states that she had a full hysterectomy (she has no uterus/ovaries). Reports no pain.

## 2023-05-28 NOTE — ED Provider Notes (Signed)
[facility]ERG[facility] DEPARTMENT AT [facility] Provider Note   CSN: [id number] Arrival date & time: 05/28/23  1718     History {Add pertinent medical, surgical, social history, OB history to HPI:1} Chief Complaint  Patient presents with   Vaginal Bleeding    Cynthia Nunez is a 73 y.o. female.  73 year old female with history of fibroids status post total hysterectomy who presents emergency department with vaginal bleeding.  Patient reports that years ago she had a hysterectomy.  Today she noticed that after she urinated she had some blood on the toilet paper that was brown and red.  No hematuria.  No blood in her stool.  Says that she wiped again later and noticed some brown-red blood coming from her vagina.  No discharge at all.  No shortness of breath or dizziness.  Not on blood thinners. No dysuria or frequency.        Home Medications Prior to Admission medications   Medication Sig Start Date End Date Taking? Authorizing Provider  amLODipine (NORVASC) 5 MG tablet Take 1 tablet (5 mg total) by mouth daily. 03/27/20   Azucena Fallen, MD  Cholecalciferol (VITAMIN D) 50 MCG (2000 UT) tablet Take 2,000 Units by mouth daily.    [provider]  meloxicam (MOBIC) 15 MG tablet Take 15 mg by mouth daily. Patient not taking: Reported on 07/05/2022 06/23/21   [provider]  rosuvastatin (CRESTOR) 5 MG tablet Take 5 mg by mouth daily. 03/25/20   [provider]      Allergies    Sudafed [pseudoephedrine hcl], Fluconazole, Sulfamethizole, and Miralax [polyethylene glycol]    Review of Systems   Review of Systems  Physical Exam Updated Vital Signs BP (!) 141/95   Pulse 61   Temp 98.5 F (36.9 C) (Oral)   Resp 18   Ht 5\' 5"  (1.651 m)   Wt 72 kg   SpO2 97%   BMI 26.41 kg/m  Physical Exam Vitals and nursing note reviewed.  Constitutional:      General: She is not in acute distress.    Appearance: She is well-developed.  HENT:      Head: Normocephalic and atraumatic.     Right Ear: External ear normal.     Left Ear: External ear normal.     Nose: Nose normal.  Eyes:     Extraocular Movements: Extraocular movements intact.     Conjunctiva/sclera: Conjunctivae normal.     Pupils: Pupils are equal, round, and reactive to light.  Pulmonary:     Effort: Pulmonary effort is normal. No respiratory distress.  Abdominal:     General: Abdomen is flat. There is no distension.     Palpations: Abdomen is soft. There is no mass.     Tenderness: There is no abdominal tenderness. There is no guarding.  Musculoskeletal:     Cervical back: Normal range of motion and neck supple.  Skin:    General: Skin is warm and dry.  Neurological:     Mental Status: She is alert and oriented to person, place, and time. Mental status is at baseline.  Psychiatric:        Mood and Affect: Mood normal.     ED Results / Procedures / Treatments   Labs (all labs ordered are listed, but only abnormal results are displayed) Labs Reviewed  CBC WITH DIFFERENTIAL/PLATELET - Abnormal; Notable for the following components:      Result Value   RBC 5.19 (*)  MCV 78.4 (*)    MCH 25.6 (*)    Platelets 105 (*)    Lymphs Abs 4.1 (*)    All other components within normal limits  COMPREHENSIVE METABOLIC PANEL - Abnormal; Notable for the following components:   Creatinine, Ser 1.04 (*)    GFR, Estimated 58 (*)    All other components within normal limits  URINALYSIS, ROUTINE W REFLEX MICROSCOPIC    EKG None  Radiology No results found.  Procedures .Pelvic exam  Date/Time: 05/28/2023 10:05 PM  Performed by: Rondel Baton, MD Authorized by: Rondel Baton, MD  Consent: Verbal consent obtained. Consent given by: patient Patient tolerance: patient tolerated the procedure well with no immediate complications Comments: Chaperoned by RN Marylu Lund.  External genitalia unremarkable. Nor rashes or lesions noted.  Speculum exam with normal  appearing whitish vaginal discharge.  Vaginal wall mucosa is unremarkable.  Blind ending vaginal pouch.   Bimanual exam without any masses appreciated.      {Document cardiac monitor, telemetry assessment procedure when appropriate:1}  Medications Ordered in ED Medications - No data to display  ED Course/ Medical Decision Making/ A&P   {   Click here for ABCD2, HEART and other calculatorsREFRESH Note before signing :1}                              Medical Decision Making Amount and/or Complexity of Data Reviewed Labs: ordered.   Cynthia Nunez is a 73 y.o. female with comorbidities that complicate the patient evaluation including fibroids status post total hysterectomy presents emergency department with vaginal bleeding  Initial Ddx:  Malignancy, atrophic vaginitis, hematuria, hematochezia  MDM/Course:  Patient presents emergency department with vaginal bleeding.  Has had a hysterectomy.  On her exam does not have any bleeding to I am able to appreciate.  There are no masses palpated.  Had a urinalysis sent that showed rare bacteria and some leukocyte Estrace.  Upon re-evaluation ***  This patient presents to the ED for concern of complaints listed in HPI, this involves an extensive number of treatment options, and is a complaint that carries with it a high risk of complications and morbidity. Disposition including potential need for admission considered.   Dispo: {Disposition:28069}  Additional history obtained from {Additional History:28067} Records reviewed {Records Reviewed:28068} The following labs were independently interpreted: {labs interpreted:28064} and show {lab findings:28250} I independently reviewed the following imaging with scope of interpretation limited to determining acute life threatening conditions related to emergency care: {imaging interpreted:28065} and agree with the radiologist interpretation with the following exceptions: none I personally  reviewed and interpreted cardiac monitoring: {cardiac monitoring:28251} I personally reviewed and interpreted the pt's EKG: see above for interpretation  I have reviewed the patients home medications and made adjustments as needed Consults: {Consultants:28063} Social Determinants of health:  ***  Portions of this note were generated with Scientist, clinical (histocompatibility and immunogenetics). Dictation errors may occur despite best attempts at proofreading.    {Document your decision making why or why not admission, treatments were needed:1} Final Clinical Impression(s) / ED Diagnoses Final diagnoses:  None    Rx / DC Orders ED Discharge Orders     None

## 2023-05-28 NOTE — Discharge Instructions (Signed)
You were seen for your vaginal bleeding in the emergency department.   Check your MyChart online for the results of any tests that had not resulted by the time you left the emergency department.   Follow-up with your OB/GYN in 2-3 days regarding your visit.    Return immediately to the emergency department if you experience any of the following: Soaking through more than 2 pads per hour for 2 hours (4 pads total and 2 hours), or any other concerning symptoms.    Thank you for visiting our Emergency Department. It was a pleasure taking care of you today.

## 2023-06-09 DIAGNOSIS — Z1231 Encounter for screening mammogram for malignant neoplasm of breast: Secondary | ICD-10-CM | POA: Diagnosis not present

## 2023-06-10 ENCOUNTER — Encounter (HOSPITAL_BASED_OUTPATIENT_CLINIC_OR_DEPARTMENT_OTHER): Payer: Self-pay | Admitting: Obstetrics & Gynecology

## 2023-06-10 ENCOUNTER — Telehealth: Payer: Self-pay

## 2023-06-10 NOTE — Telephone Encounter (Signed)
Pt LVM in triage line stating that she is having dark red bleeding again like when she went to the ED on 05/28/2023 for. Pt is not sure where it could be coming from since she no longer has a uterus. Pt reports already accepting an appt w/ appt desk w/ EB on 06/14/2023.   Info forwarded to provider on call.

## 2023-06-14 ENCOUNTER — Ambulatory Visit (INDEPENDENT_AMBULATORY_CARE_PROVIDER_SITE_OTHER): Payer: Medicare HMO | Admitting: Obstetrics and Gynecology

## 2023-06-14 ENCOUNTER — Other Ambulatory Visit (HOSPITAL_COMMUNITY)
Admission: RE | Admit: 2023-06-14 | Discharge: 2023-06-14 | Disposition: A | Discharge status: Home / Self Care | Payer: Medicare HMO | Source: Ambulatory Visit | Attending: Obstetrics and Gynecology | Admitting: Obstetrics and Gynecology

## 2023-06-14 ENCOUNTER — Encounter: Payer: Self-pay | Admitting: Obstetrics and Gynecology

## 2023-06-14 VITALS — BP 116/72 | HR 64

## 2023-06-14 DIAGNOSIS — B9689 Other specified bacterial agents as the cause of diseases classified elsewhere: Secondary | ICD-10-CM | POA: Insufficient documentation

## 2023-06-14 DIAGNOSIS — Z01411 Encounter for gynecological examination (general) (routine) with abnormal findings: Secondary | ICD-10-CM | POA: Insufficient documentation

## 2023-06-14 DIAGNOSIS — N3091 Cystitis, unspecified with hematuria: Secondary | ICD-10-CM | POA: Insufficient documentation

## 2023-06-14 DIAGNOSIS — Z1151 Encounter for screening for human papillomavirus (HPV): Secondary | ICD-10-CM | POA: Insufficient documentation

## 2023-06-14 DIAGNOSIS — N939 Abnormal uterine and vaginal bleeding, unspecified: Secondary | ICD-10-CM | POA: Insufficient documentation

## 2023-06-14 LAB — WET PREP FOR TRICH, YEAST, CLUE

## 2023-06-14 MED ORDER — FLUCONAZOLE 150 MG PO TABS
150.0000 mg | ORAL_TABLET | Freq: Every day | ORAL | 0 refills | Status: AC
Start: 2023-06-14 — End: 2023-06-15

## 2023-06-14 MED ORDER — INTRAROSA 6.5 MG VA INST
1.0000 | VAGINAL_INSERT | Freq: Every evening | VAGINAL | 12 refills | Status: DC | PRN
Start: 2023-06-14 — End: 2023-06-16

## 2023-06-14 MED ORDER — CEPHALEXIN 500 MG PO CAPS: 500.0000 mg | ORAL_CAPSULE | Freq: Two times a day (BID) | ORAL | 0 refills | Status: AC

## 2023-06-14 NOTE — Progress Notes (Signed)
 Patient presents with two episodes of bleeding. She has had a hysterectomy.  Denies any abnormal pathology or history of abnormal pap smears.  She would like to get her pap smear collected today She is sure it is from the vagina.  First episode on the 18th and noted after voiding. She went to the ER and they didn't find the source. Second episode was on the 31st of Jan and it was noted on a pillow she was sitting on and was more than spotting.  She went to the bathroom and noticed on the paper and then wiped in the vagina and saw it on there and then it stopped. She denies any back pain or fevers or dysuria. Is sexually active and can have some spotting after intercourse but this was not related to intercourse.  SVE: atrophic vaginitis. No lesions or bleeding noted Urethra with no carbuncle  UA with 1+ LE and 10-20 wbc Moderate bacteria seen  Wet mount negative for yeast, trich, clue cells   A/p hematuria likely due to cystitis To get renal US  to evaluate for stones Keflex  sent for UTI.  Discussed sending diflucan , if she needed after for yeast infection.  She denied allergy to this.  Reported allergy joint swelling To begin intrarosa  for atrophic vaginitis. She has tried estrogen creams in the past with no success. Dr. Glennon

## 2023-06-15 ENCOUNTER — Telehealth: Payer: Self-pay

## 2023-06-15 DIAGNOSIS — N952 Postmenopausal atrophic vaginitis: Secondary | ICD-10-CM

## 2023-06-15 NOTE — Telephone Encounter (Signed)
 Patient calls the office back & states that she looked in her chart and Dr Tia Flowers put that she had used estrogen creams before with no success but she did not remember using any. Patient aware all information has been sent to Dr Tia Flowers for review

## 2023-06-15 NOTE — Telephone Encounter (Addendum)
 Patient came into the office & stated that she went to [facility] in [place] to get her rx for intrarosa . Pt states they told her that her insurance will not cover it & will need a PA. Per patient, she states she has never used another hrt or estrogen cream before so her insurance still won't cover it. Patient states she does not want to fill out all of the information for the coupon & would like to use something else for the vaginal dryness.  Routing to Dr Glennon.

## 2023-06-16 ENCOUNTER — Encounter: Payer: Self-pay | Admitting: Obstetrics and Gynecology

## 2023-06-16 ENCOUNTER — Telehealth: Payer: Self-pay

## 2023-06-16 DIAGNOSIS — N3091 Cystitis, unspecified with hematuria: Secondary | ICD-10-CM

## 2023-06-16 DIAGNOSIS — N939 Abnormal uterine and vaginal bleeding, unspecified: Secondary | ICD-10-CM

## 2023-06-16 LAB — URINALYSIS, COMPLETE W/RFL CULTURE
Bilirubin Urine: NEGATIVE
Glucose, UA: NEGATIVE
Hgb urine dipstick: NEGATIVE
Hyaline Cast: NONE SEEN /LPF
Ketones, ur: NEGATIVE
Nitrites, Initial: NEGATIVE
Protein, ur: NEGATIVE
Specific Gravity, Urine: 1.01 (ref 1.001–1.035)
pH: 6 (ref 5.0–8.0)

## 2023-06-16 LAB — URINE CULTURE
MICRO NUMBER:: 16038843
Result:: NO GROWTH
SPECIMEN QUALITY:: ADEQUATE

## 2023-06-16 LAB — CULTURE INDICATED

## 2023-06-16 MED ORDER — ESTRADIOL 0.1 MG/GM VA CREA: TOPICAL_CREAM | VAGINAL | 6 refills | Status: AC

## 2023-06-16 NOTE — Telephone Encounter (Signed)
 From what I see in her chart, it is not clear where the bleeding is coming from.   It could be from the urinary system, the reproductive tract, or the intestine.   Dr. Glennon felt it was important to evaluate the kidneys.  I recommend she proceed with the renal ultrasound.  If this is negative, she may need to see her PCP or GI doctor to evaluate the intestinal tract.

## 2023-06-16 NOTE — Telephone Encounter (Signed)
 Spoke with patient. Patient states she used a vaginal cream many years ago for seven days when she had an infection, never vaginal estrogen.   Advised of alternative Rx provided by Dr. Glennon, instructions reviewed, Rx sent to confirmed pharmacy on file. Patient aware to contact office if any assistance needed.   Intrarosa  Rx discontinued.   Patient appreciative of call.   Encounter closed.

## 2023-06-16 NOTE — Telephone Encounter (Signed)
 Refer to mychart msg dated 06/10/2023 from on-call physician and pt was seen on 06/14/2023 by EB.   Encounter closed.

## 2023-06-16 NOTE — Telephone Encounter (Signed)
 Spoke with patient. Patient states she was uncertain as to why she was advised to stop abx. Explained UA results and culture, questions answered. Patient states she had 2 episodes of vaginal bleeding, Hx of hysterectomy, Wants to get this resolved.  Patient states renal US  also ordered, waiting to schedule. Picked up vaginal estrogen cream today. Will stop Abx.   Patient asking if she needs to proceed with renal US ?   Patient denies any new symptoms or bleeding.   Advised patient I will provide update and return call if any additional recommendations. Patient agreeable.   Dr. Nikki -please review and advise.

## 2023-06-16 NOTE — Telephone Encounter (Signed)
 Patient called & left a message on triage voicemail. She has questions regarding her lab results. Looks like Dr Colvin Dec sent her a message regarding her urine results.

## 2023-06-17 ENCOUNTER — Ambulatory Visit
Admission: RE | Admit: 2023-06-17 | Discharge: 2023-06-17 | Disposition: A | Discharge status: Home / Self Care | Payer: Medicare HMO | Source: Ambulatory Visit | Attending: Obstetrics and Gynecology | Admitting: Obstetrics and Gynecology

## 2023-06-17 DIAGNOSIS — N939 Abnormal uterine and vaginal bleeding, unspecified: Secondary | ICD-10-CM

## 2023-06-17 DIAGNOSIS — N390 Urinary tract infection, site not specified: Secondary | ICD-10-CM | POA: Diagnosis not present

## 2023-06-17 DIAGNOSIS — N3091 Cystitis, unspecified with hematuria: Secondary | ICD-10-CM

## 2023-06-17 NOTE — Telephone Encounter (Signed)
 Spoke with patient, advised as seen below. Patient request US  order to [facility], new order placed. Contact information provided for scheduling. Patient aware to contact office if she needs assistance with scheduling or unable to schedule.   Patient appreciative of call.   Encounter closed.

## 2023-06-19 ENCOUNTER — Encounter: Payer: Self-pay | Admitting: Obstetrics and Gynecology

## 2023-06-20 ENCOUNTER — Other Ambulatory Visit: Payer: Medicare HMO

## 2023-06-20 LAB — CYTOLOGY - PAP
Adequacy: ABSENT
Comment: NEGATIVE
Diagnosis: NEGATIVE
Diagnosis: REACTIVE
High risk HPV: NEGATIVE

## 2023-06-23 ENCOUNTER — Telehealth: Payer: Self-pay

## 2023-06-23 NOTE — Telephone Encounter (Signed)
PA initiation started by specialty pharmacy for Intrarosa rx.  Via CoverMyMeds Key: [id number]  Per telephone encounter dated 06/15/2023. Alternate rx prescribed.   Will not move forward with PA at this time.  Encounter closed.

## 2023-07-29 DIAGNOSIS — M7989 Other specified soft tissue disorders: Secondary | ICD-10-CM | POA: Diagnosis not present

## 2023-07-29 DIAGNOSIS — W57XXXA Bitten or stung by nonvenomous insect and other nonvenomous arthropods, initial encounter: Secondary | ICD-10-CM | POA: Diagnosis not present

## 2023-08-01 ENCOUNTER — Ambulatory Visit (HOSPITAL_BASED_OUTPATIENT_CLINIC_OR_DEPARTMENT_OTHER)
Admission: RE | Admit: 2023-08-01 | Discharge: 2023-08-01 | Disposition: A | Discharge status: Home / Self Care | Payer: Medicare HMO | Source: Ambulatory Visit | Attending: Radiology | Admitting: Radiology

## 2023-08-01 DIAGNOSIS — Z780 Asymptomatic menopausal state: Secondary | ICD-10-CM | POA: Diagnosis not present

## 2023-08-01 DIAGNOSIS — Z13820 Encounter for screening for osteoporosis: Secondary | ICD-10-CM | POA: Diagnosis not present

## 2023-09-15 DIAGNOSIS — M2041 Other hammer toe(s) (acquired), right foot: Secondary | ICD-10-CM | POA: Diagnosis not present

## 2023-09-15 DIAGNOSIS — M2011 Hallux valgus (acquired), right foot: Secondary | ICD-10-CM | POA: Diagnosis not present

## 2023-09-15 DIAGNOSIS — M2042 Other hammer toe(s) (acquired), left foot: Secondary | ICD-10-CM | POA: Diagnosis not present

## 2023-09-15 DIAGNOSIS — M205X1 Other deformities of toe(s) (acquired), right foot: Secondary | ICD-10-CM | POA: Diagnosis not present

## 2023-09-15 DIAGNOSIS — M2012 Hallux valgus (acquired), left foot: Secondary | ICD-10-CM | POA: Diagnosis not present

## 2023-09-26 DIAGNOSIS — M19071 Primary osteoarthritis, right ankle and foot: Secondary | ICD-10-CM | POA: Diagnosis not present

## 2023-09-26 DIAGNOSIS — M2011 Hallux valgus (acquired), right foot: Secondary | ICD-10-CM | POA: Diagnosis not present

## 2023-09-26 DIAGNOSIS — S99921A Unspecified injury of right foot, initial encounter: Secondary | ICD-10-CM | POA: Diagnosis not present

## 2023-09-26 DIAGNOSIS — M792 Neuralgia and neuritis, unspecified: Secondary | ICD-10-CM | POA: Diagnosis not present

## 2023-09-26 DIAGNOSIS — M2042 Other hammer toe(s) (acquired), left foot: Secondary | ICD-10-CM | POA: Diagnosis not present

## 2023-09-26 DIAGNOSIS — M21961 Unspecified acquired deformity of right lower leg: Secondary | ICD-10-CM | POA: Diagnosis not present

## 2023-09-26 DIAGNOSIS — M2041 Other hammer toe(s) (acquired), right foot: Secondary | ICD-10-CM | POA: Diagnosis not present

## 2023-11-01 DIAGNOSIS — H40013 Open angle with borderline findings, low risk, bilateral: Secondary | ICD-10-CM | POA: Diagnosis not present

## 2024-02-13 DIAGNOSIS — Z0000 Encounter for general adult medical examination without abnormal findings: Secondary | ICD-10-CM | POA: Diagnosis not present

## 2024-02-13 DIAGNOSIS — E7800 Pure hypercholesterolemia, unspecified: Secondary | ICD-10-CM | POA: Diagnosis not present

## 2024-02-13 DIAGNOSIS — Z1331 Encounter for screening for depression: Secondary | ICD-10-CM | POA: Diagnosis not present

## 2024-02-13 DIAGNOSIS — I10 Essential (primary) hypertension: Secondary | ICD-10-CM | POA: Diagnosis not present

## 2024-02-13 DIAGNOSIS — Z5181 Encounter for therapeutic drug level monitoring: Secondary | ICD-10-CM | POA: Diagnosis not present

## 2024-04-18 DIAGNOSIS — Z0100 Encounter for examination of eyes and vision without abnormal findings: Secondary | ICD-10-CM | POA: Diagnosis not present

## 2024-07-09 ENCOUNTER — Ambulatory Visit: Admitting: Obstetrics and Gynecology
